# Patient Record
Sex: Female | Born: 1995 | Race: White | Hispanic: No | Marital: Single | Smoking: Current some day smoker
Health system: Southern US, Community
[De-identification: ages and names within clinical notes are randomized; demographics above are authoritative.]

## PROBLEM LIST (undated history)

## (undated) DIAGNOSIS — Z87820 Personal history of traumatic brain injury: Secondary | ICD-10-CM

---
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
## 2016-06-21 ENCOUNTER — Encounter: Payer: Self-pay | Admitting: Emergency Medicine

## 2016-06-21 ENCOUNTER — Emergency Department (INDEPENDENT_AMBULATORY_CARE_PROVIDER_SITE_OTHER)
Admission: EM | Admit: 2016-06-21 | Discharge: 2016-06-21 | Disposition: A | Discharge status: Home / Self Care | Payer: Self-pay | Source: Home / Self Care | Attending: Family Medicine | Admitting: Family Medicine

## 2016-06-21 DIAGNOSIS — Z3A01 Less than 8 weeks gestation of pregnancy: Secondary | ICD-10-CM

## 2016-06-21 DIAGNOSIS — R112 Nausea with vomiting, unspecified: Secondary | ICD-10-CM

## 2016-06-21 LAB — POCT URINALYSIS DIP (MANUAL ENTRY)
BILIRUBIN UA: NEGATIVE
Ketones, POC UA: NEGATIVE
Leukocytes, UA: NEGATIVE
NITRITE UA: NEGATIVE
Protein Ur, POC: NEGATIVE
RBC UA: NEGATIVE
Spec Grav, UA: 1.015 (ref 1.005–1.03)
Urobilinogen, UA: 0.2 (ref 0–1)
pH, UA: 7 (ref 5–8)

## 2016-06-21 LAB — POCT URINE PREGNANCY: Preg Test, Ur: POSITIVE — AB

## 2016-06-21 MED ORDER — PROMETHAZINE HCL 12.5 MG PO TABS: 12.5000 mg | ORAL_TABLET | Freq: Four times a day (QID) | ORAL | 0 refills | Status: DC | PRN

## 2016-06-21 NOTE — ED Provider Notes (Signed)
[facility]    CSN: [id number] Arrival date & time: 06/21/16  1628     History   Chief Complaint Chief Complaint  Patient presents with  . Abdominal Pain  . Nausea  . Emesis    HPI Jenna Michaelslexis [facility] is a 21 y.o. female.   Patient complains of one week history of nausea/vomiting and occasional lower abdominal pain.  No fevers, chills, and sweats.  No pelvic pain.  No urinary symptoms.  Patient reports that her last menstrual period was approximately 1.5 months ago.  No vaginal discharge or bleeding.   The history is provided by the patient.  Abdominal Pain  Pain location:  RLQ and LLQ Pain quality: aching   Pain radiates to:  Does not radiate Pain severity:  Mild Onset quality:  Gradual Duration:  1 week Timing:  Sporadic Progression:  Unchanged Chronicity:  New Context: not awakening from sleep, not eating, not recent illness, not recent travel, not sick contacts and not suspicious food intake   Relieved by:  None tried Worsened by:  Nothing Ineffective treatments:  None tried Associated symptoms: nausea and vomiting   Associated symptoms: no anorexia, no belching, no chest pain, no chills, no constipation, no cough, no diarrhea, no dysuria, no fatigue, no fever, no hematemesis, no hematochezia, no hematuria, no melena, no shortness of breath, no sore throat, no vaginal bleeding and no vaginal discharge   Emesis  Associated symptoms: abdominal pain   Associated symptoms: no chills, no cough, no diarrhea, no fever and no sore throat     Past Medical History:  Diagnosis Date  . History of concussion     There are no active problems to display for this patient.   History reviewed. No pertinent surgical history.  OB History    Gravida Para Term Preterm AB Living   1             SAB TAB Ectopic Multiple Live Births                   Home Medications    Prior to Admission medications   Medication Sig Start Date End Date Taking? Authorizing Provider    promethazine (PHENERGAN) 12.5 MG tablet Take 1 tablet (12.5 mg total) by mouth every 6 (six) hours as needed for nausea or vomiting. 06/21/16   Lattie HawStephen A Beese, MD    Family History History reviewed. No pertinent family history.  Social History Social History  Substance Use Topics  . Smoking status: Current Every Day Smoker  . Smokeless tobacco: Never Used  . Alcohol use No     Allergies   Tylenol [acetaminophen]   Review of Systems Review of Systems  Constitutional: Negative for chills, fatigue and fever.  HENT: Negative for sore throat.   Respiratory: Negative for cough and shortness of breath.   Cardiovascular: Negative for chest pain.  Gastrointestinal: Positive for abdominal pain, nausea and vomiting. Negative for anorexia, constipation, diarrhea, hematemesis, hematochezia and melena.  Genitourinary: Negative for dysuria, hematuria, vaginal bleeding and vaginal discharge.     Physical Exam Triage Vital Signs ED Triage Vitals  Enc Vitals Group     BP 06/21/16 1659 112/62     Pulse Rate 06/21/16 1659 85     Resp 06/21/16 1659 16     Temp 06/21/16 1659 98.4 F (36.9 C)     Temp Source 06/21/16 1659 Oral     SpO2 06/21/16 1659 99 %     Weight 06/21/16 1700 143 lb (  64.9 kg)     Height 06/21/16 1700 5' 2.5" (1.588 m)     Head Circumference --      Peak Flow --      Pain Score --      Pain Loc --      Pain Edu? --      Excl. in GC? --    No data found.   Updated Vital Signs BP 112/62 (BP Location: Left Arm)   Pulse 85   Temp 98.4 F (36.9 C) (Oral)   Resp 16   Ht 5' 2.5" (1.588 m)   Wt 143 lb (64.9 kg)   LMP  (LMP Unknown)   SpO2 99%   BMI 25.74 kg/m   Visual Acuity Right Eye Distance:   Left Eye Distance:   Bilateral Distance:    Right Eye Near:   Left Eye Near:    Bilateral Near:     Physical Exam Nursing notes and Vital Signs reviewed. Appearance:  Patient appears stated age, and in no acute distress Eyes:  Pupils are equal, round, and  reactive to light and accomodation.  Extraocular movement is intact.  Conjunctivae are not inflamed  Ears:  Normal externally Nose:  Normal  Pharynx:  Normal; moist mucous membranes  Neck:  Supple.  No adenopathy. Lungs:  Clear to auscultation.  Breath sounds are equal.  Moving air well. Heart:  Regular rate and rhythm without murmurs, rubs, or gallops.  Abdomen:  Nontender without masses or hepatosplenomegaly.  Bowel sounds are present.  No CVA or flank tenderness.  Extremities:  No edema.  Skin:  No rash present.    UC Treatments / Results  Labs (all labs ordered are listed, but only abnormal results are displayed) Labs Reviewed  POCT URINE PREGNANCY - Abnormal; Notable for the following:       Result Value   Preg Test, Ur Positive (*)    All other components within normal limits  POCT URINALYSIS DIP (MANUAL ENTRY) negative    EKG  EKG Interpretation None       Radiology No results found.  Procedures Procedures (including critical care time)  Medications Ordered in UC Medications - No data to display   Initial Impression / Assessment and Plan / UC Course  I have reviewed the triage vital signs and the nursing notes.  Pertinent labs & imaging results that were available during my care of the patient were reviewed by me and considered in my medical decision making (see chart for details).  Clinical Course   Rx for Phenergan 12.5mg  Q6hr prn. If symptoms become significantly worse during the night or over the weekend, proceed to the local emergency room.  Followup with OBGYN as soon as possible.      Final Clinical Impressions(s) / UC Diagnoses   Final diagnoses:  Non-intractable vomiting with nausea, unspecified vomiting type  Less than [redacted] weeks gestation of pregnancy    New Prescriptions New Prescriptions   PROMETHAZINE (PHENERGAN) 12.5 MG TABLET    Take 1 tablet (12.5 mg total) by mouth every 6 (six) hours as needed for nausea or vomiting.     Lattie Haw, MD 07/04/16 [phone]

## 2016-06-21 NOTE — ED Triage Notes (Signed)
Patient reports nausea and vomiting with occasional abdominal pain for past week; uncertain LMP. No otcs today.

## 2016-06-21 NOTE — Discharge Instructions (Signed)
If symptoms become significantly worse during the night or over the weekend, proceed to the local emergency room.  

## 2020-08-11 ENCOUNTER — Other Ambulatory Visit: Payer: Self-pay

## 2020-08-11 ENCOUNTER — Encounter (HOSPITAL_BASED_OUTPATIENT_CLINIC_OR_DEPARTMENT_OTHER): Payer: Self-pay

## 2020-08-11 ENCOUNTER — Emergency Department (HOSPITAL_BASED_OUTPATIENT_CLINIC_OR_DEPARTMENT_OTHER)
Admission: EM | Admit: 2020-08-11 | Discharge: 2020-08-12 | Disposition: A | Discharge status: Home / Self Care | Payer: Medicaid Other | Attending: Emergency Medicine | Admitting: Emergency Medicine

## 2020-08-11 DIAGNOSIS — F17200 Nicotine dependence, unspecified, uncomplicated: Secondary | ICD-10-CM | POA: Diagnosis not present

## 2020-08-11 DIAGNOSIS — N760 Acute vaginitis: Secondary | ICD-10-CM | POA: Insufficient documentation

## 2020-08-11 DIAGNOSIS — B9689 Other specified bacterial agents as the cause of diseases classified elsewhere: Secondary | ICD-10-CM | POA: Insufficient documentation

## 2020-08-11 DIAGNOSIS — R1030 Lower abdominal pain, unspecified: Secondary | ICD-10-CM | POA: Diagnosis present

## 2020-08-11 DIAGNOSIS — N939 Abnormal uterine and vaginal bleeding, unspecified: Secondary | ICD-10-CM

## 2020-08-11 LAB — WET PREP, GENITAL
Sperm: NONE SEEN
Trich, Wet Prep: NONE SEEN
Yeast Wet Prep HPF POC: NONE SEEN

## 2020-08-11 LAB — PREGNANCY, URINE: Preg Test, Ur: NEGATIVE

## 2020-08-11 MED ORDER — METRONIDAZOLE 500 MG PO TABS: 500.0000 mg | ORAL_TABLET | Freq: Two times a day (BID) | ORAL | 0 refills | Status: DC

## 2020-08-11 MED ORDER — METRONIDAZOLE 500 MG PO TABS
500.0000 mg | ORAL_TABLET | Freq: Once | ORAL | Status: AC
  Administered 2020-08-11: 500 mg via ORAL
  Filled 2020-08-11: qty 1

## 2020-08-11 NOTE — ED Provider Notes (Signed)
[facility] EMERGENCY DEPARTMENT Provider Note   CSN: [id number] Arrival date & time: 08/11/20  1924     History Chief Complaint  Patient presents with  . Abdominal Pain  . Vaginal Bleeding    Jenna Glover is a 25 y.o. female G1 P1, presenting to the emergency department with complaint of vaginal discharge and lower abdominal pain that began yesterday.  She states she noticed some brown-tinged discharge or spotting.  She is also having lower abdominal pain that feels like a terrible menstrual cramps.  LMP is 07/19/2020, and was normal.  Usually has regular menstrual periods.  Denies fevers or chills, urinary symptoms.  She is sexually active and monogamous relationship with her fianc.  Does not currently have a gynecologist.   The history is provided by the patient.       Past Medical History:  Diagnosis Date  . History of concussion     There are no problems to display for this patient.   History reviewed. No pertinent surgical history.   OB History    Gravida  1   Para      Term      Preterm      AB      Living        SAB      IAB      Ectopic      Multiple      Live Births              History reviewed. No pertinent family history.  Social History   Tobacco Use  . Smoking status: Current Some Day Smoker  . Smokeless tobacco: Never Used  Vaping Use  . Vaping Use: Some days  Substance Use Topics  . Alcohol use: Yes    Comment: occassional  . Drug use: Yes    Types: Marijuana    Home Medications Prior to Admission medications   Medication Sig Start Date End Date Taking? Authorizing Provider  metroNIDAZOLE (FLAGYL) 500 MG tablet Take 1 tablet (500 mg total) by mouth 2 (two) times daily. 08/11/20  Yes Balbina Depace, [place] N, PA-C  promethazine (PHENERGAN) 12.5 MG tablet Take 1 tablet (12.5 mg total) by mouth every 6 (six) hours as needed for nausea or vomiting. 06/21/16   Lattie Haw, MD    Allergies    Tylenol  [acetaminophen]  Review of Systems   Review of Systems  Genitourinary: Positive for pelvic pain and vaginal discharge.  All other systems reviewed and are negative.   Physical Exam Updated Vital Signs BP 119/80   Pulse 60   Temp 98 F (36.7 C) (Oral)   Resp 18   Ht 5\' 3"  (1.6 m)   Wt 61.7 kg   LMP 07/28/2020   SpO2 96%   Breastfeeding Unknown   BMI 24.09 kg/m   Physical Exam Vitals and nursing note reviewed.  Constitutional:      General: She is not in acute distress.    Appearance: She is well-developed and well-nourished.  HENT:     Head: Normocephalic and atraumatic.  Eyes:     Conjunctiva/sclera: Conjunctivae normal.  Cardiovascular:     Rate and Rhythm: Normal rate and regular rhythm.  Pulmonary:     Effort: Pulmonary effort is normal.     Breath sounds: Normal breath sounds.  Abdominal:     General: Abdomen is flat.     Palpations: Abdomen is soft.     Tenderness: There is no abdominal tenderness.  Genitourinary:  Comments: Exam performed with female nurse tech chaperone present.  There is clear cervical mucus present with streaks of brown.  No CMT.  There is some discomfort with palpation of the left adnexa. Skin:    General: Skin is warm.  Neurological:     Mental Status: She is alert.  Psychiatric:        Mood and Affect: Mood and affect normal.        Behavior: Behavior normal.     ED Results / Procedures / Treatments   Labs (all labs ordered are listed, but only abnormal results are displayed) Labs Reviewed  WET PREP, GENITAL - Abnormal; Notable for the following components:      Result Value   Clue Cells Wet Prep HPF POC PRESENT (*)    WBC, Wet Prep HPF POC FEW (*)    All other components within normal limits  PREGNANCY, URINE  GC/CHLAMYDIA PROBE AMP ([place]) NOT AT [facility]    EKG None  Radiology No results found.  Procedures Procedures   Medications Ordered in ED Medications  metroNIDAZOLE (FLAGYL) tablet 500 mg (500 mg  Oral Given by Other 08/11/20 2358)  ketorolac (TORADOL) 30 MG/ML injection 30 mg (30 mg Intramuscular Given 08/12/20 0007)    ED Course  I have reviewed the triage vital signs and the nursing notes.  Pertinent labs & imaging results that were available during my care of the patient were reviewed by me and considered in my medical decision making (see chart for details).    MDM Rules/Calculators/A&P                          Patient presenting for brown-tinged vaginal discharge on lower abdominal cramping since yesterday.  Her urine preg is negative.  On exam she is well-appearing in no distress.  Pelvic exam with discharge as described.  Some mild discomfort on bimanual though no significant pain was elicited.  Her wet prep shows few white cells and clue cells present.  GC/Chlamydia cultures are sent and pending.  Considering patient's change in discharge, will cover for BV.  She is provided with GYN referral for follow-up.  Exam not concerning for PID, low risk sexual behavior and few white cells on wet prep.  Symptomatic management with over-the-counter NSAIDs, topical heat.  Return precautions.  Discussed results, findings, treatment and follow up. Patient advised of return precautions. Patient verbalized understanding and agreed with plan.  Final Clinical Impression(s) / ED Diagnoses Final diagnoses:  BV (bacterial vaginosis)  Vaginal spotting    Rx / DC Orders ED Discharge Orders         Ordered    metroNIDAZOLE (FLAGYL) 500 MG tablet  2 times daily        08/11/20 2351           Breeona Waid, [place] N, [place] 08/12/20 0024    Maia Plan, MD 08/19/20 1006

## 2020-08-11 NOTE — ED Triage Notes (Signed)
Pt arrives c/o brown vaginal discharge intermittently with lower abdominal/pelvic pain. Nausea with increases in pain. Went UC today, negative pregnancy, was told to come here if pain didn't improve.

## 2020-08-11 NOTE — Discharge Instructions (Addendum)
Please read the instructions below.  Talk with your primary care provider about any new medications.  Please schedule an appointment for follow up with your OBGYN or primary care.  Finish your antibiotic (Flagyl/Metronidazole) as prescribed. Do not drink alcohol with this medication as it will cause vomiting.  You will receive a call from the hospital if your test results come back positive. Avoid all sexual activity until you know your test results. If your results come back positive, it is important that you inform all of your sexual partners.  Return to the ER for high fever, severe abdominal pain, or new or worsening symptoms.

## 2020-08-12 MED ORDER — KETOROLAC TROMETHAMINE 30 MG/ML IJ SOLN
30.0000 mg | Freq: Once | INTRAMUSCULAR | Status: DC
  Filled 2020-08-12: qty 1

## 2020-08-12 MED ORDER — KETOROLAC TROMETHAMINE 30 MG/ML IJ SOLN
30.0000 mg | Freq: Once | INTRAMUSCULAR | Status: AC
  Administered 2020-08-12: 30 mg via INTRAMUSCULAR

## 2020-08-13 LAB — GC/CHLAMYDIA PROBE AMP (~~LOC~~) NOT AT ARMC
Chlamydia: NEGATIVE
Comment: NEGATIVE
Comment: NORMAL
Neisseria Gonorrhea: NEGATIVE

## 2020-11-09 ENCOUNTER — Emergency Department (INDEPENDENT_AMBULATORY_CARE_PROVIDER_SITE_OTHER)
Admission: EM | Admit: 2020-11-09 | Discharge: 2020-11-09 | Disposition: A | Discharge status: Home / Self Care | Payer: Medicaid Other | Source: Home / Self Care

## 2020-11-09 ENCOUNTER — Encounter: Payer: Self-pay | Admitting: Emergency Medicine

## 2020-11-09 ENCOUNTER — Other Ambulatory Visit: Payer: Self-pay

## 2020-11-09 DIAGNOSIS — T63441A Toxic effect of venom of bees, accidental (unintentional), initial encounter: Secondary | ICD-10-CM

## 2020-11-09 NOTE — ED Triage Notes (Signed)
Patient here day 4 having been stung by flying insect/possible wasp on upper outer left thigh; now large area around stinger site is discolored; somewhat itchy. Has tried benadryl which helps a little. Has not had any covid vaccinations.

## 2020-11-11 NOTE — ED Provider Notes (Signed)
[facility]    CSN: [id number] Arrival date & time: 11/09/20  1909      History   Chief Complaint Chief Complaint  Patient presents with  . Insect Bite    HPI Jenna Glover is a 25 y.o. female.   Pt reports she ws stung by a wasp 4 days ago.  Pt reports she has a swollen bruised area around sting.    The history is provided by the patient. No language interpreter was used.  Leg Pain Location:  Leg Ineffective treatments:  None tried Associated symptoms: swelling     Past Medical History:  Diagnosis Date  . History of concussion     There are no problems to display for this patient.   History reviewed. No pertinent surgical history.  OB History    Gravida  1   Para      Term      Preterm      AB      Living        SAB      IAB      Ectopic      Multiple      Live Births               Home Medications    Prior to Admission medications   Medication Sig Start Date End Date Taking? Authorizing Provider  metroNIDAZOLE (FLAGYL) 500 MG tablet Take 1 tablet (500 mg total) by mouth 2 (two) times daily. 08/11/20   Robinson, [place] N, PA-C  promethazine (PHENERGAN) 12.5 MG tablet Take 1 tablet (12.5 mg total) by mouth every 6 (six) hours as needed for nausea or vomiting. 06/21/16   Lattie Haw, MD    Family History No family history on file.  Social History Social History   Tobacco Use  . Smoking status: Current Some Day Smoker  . Smokeless tobacco: Never Used  Vaping Use  . Vaping Use: Some days  Substance Use Topics  . Alcohol use: Yes    Comment: occassional  . Drug use: Yes    Types: Marijuana     Allergies   Tylenol [acetaminophen]   Review of Systems Review of Systems  All other systems reviewed and are negative.    Physical Exam Triage Vital Signs ED Triage Vitals  Enc Vitals Group     BP 11/09/20 1930 107/72     Pulse Rate 11/09/20 1930 78     Resp 11/09/20 1930 16     Temp 11/09/20 1930 98.2 F  (36.8 C)     Temp Source 11/09/20 1930 Oral     SpO2 11/09/20 1930 98 %     Weight 11/09/20 1934 135 lb (61.2 kg)     Height 11/09/20 1934 5\' 3"  (1.6 m)     Head Circumference --      Peak Flow --      Pain Score 11/09/20 1934 0     Pain Loc --      Pain Edu? --      Excl. in GC? --    No data found.  Updated Vital Signs BP 107/72 (BP Location: Right Arm)   Pulse 78   Temp 98.2 F (36.8 C) (Oral)   Resp 16   Ht 5\' 3"  (1.6 m)   Wt 61.2 kg   LMP 09/16/2020 (Approximate)   SpO2 98%   BMI 23.91 kg/m   Visual Acuity Right Eye Distance:   Left Eye Distance:   Bilateral Distance:  Right Eye Near:   Left Eye Near:    Bilateral Near:     Physical Exam Vitals and nursing note reviewed.  Constitutional:      Appearance: She is well-developed.  HENT:     Head: Normocephalic.  Musculoskeletal:     Cervical back: Normal range of motion.  Skin:    General: Skin is warm.     Findings: Bruising and erythema present.     Comments: 10 cm bruised area with small area of induration in center   Neurological:     Mental Status: She is alert and oriented to person, place, and time.  Psychiatric:        Mood and Affect: Mood normal.      UC Treatments / Results  Labs (all labs ordered are listed, but only abnormal results are displayed) Labs Reviewed - No data to display  EKG   Radiology No results found.  Procedures Procedures (including critical care time)  Medications Ordered in UC Medications - No data to display  Initial Impression / Assessment and Plan / UC Course  I have reviewed the triage vital signs and the nursing notes.  Pertinent labs & imaging results that were available during my care of the patient were reviewed by me and considered in my medical decision making (see chart for details).     MDM;  Pt counseled on insect bites  Final Clinical Impressions(s) / UC Diagnoses   Final diagnoses:  Bee sting, accidental or unintentional, initial  encounter   Discharge Instructions   None    ED Prescriptions    None     PDMP not reviewed this encounter.  An After Visit Summary was printed and given to the patient.    Elson Areas, [place] 11/11/20 1506

## 2021-04-05 ENCOUNTER — Emergency Department (HOSPITAL_BASED_OUTPATIENT_CLINIC_OR_DEPARTMENT_OTHER): Payer: Medicaid Other | Admitting: Radiology

## 2021-04-05 ENCOUNTER — Other Ambulatory Visit: Payer: Self-pay

## 2021-04-05 ENCOUNTER — Encounter (HOSPITAL_BASED_OUTPATIENT_CLINIC_OR_DEPARTMENT_OTHER): Payer: Self-pay

## 2021-04-05 ENCOUNTER — Emergency Department (HOSPITAL_BASED_OUTPATIENT_CLINIC_OR_DEPARTMENT_OTHER): Payer: Medicaid Other

## 2021-04-05 ENCOUNTER — Observation Stay (HOSPITAL_BASED_OUTPATIENT_CLINIC_OR_DEPARTMENT_OTHER)
Admission: EM | Admit: 2021-04-05 | Discharge: 2021-04-07 | Disposition: A | Discharge status: Home / Self Care | Payer: Medicaid Other | Attending: Internal Medicine | Admitting: Internal Medicine

## 2021-04-05 DIAGNOSIS — Z20822 Contact with and (suspected) exposure to covid-19: Secondary | ICD-10-CM | POA: Insufficient documentation

## 2021-04-05 DIAGNOSIS — R55 Syncope and collapse: Secondary | ICD-10-CM | POA: Diagnosis present

## 2021-04-05 DIAGNOSIS — B9620 Unspecified Escherichia coli [E. coli] as the cause of diseases classified elsewhere: Secondary | ICD-10-CM | POA: Diagnosis not present

## 2021-04-05 DIAGNOSIS — R404 Transient alteration of awareness: Secondary | ICD-10-CM | POA: Diagnosis not present

## 2021-04-05 DIAGNOSIS — F17200 Nicotine dependence, unspecified, uncomplicated: Secondary | ICD-10-CM | POA: Insufficient documentation

## 2021-04-05 DIAGNOSIS — R519 Headache, unspecified: Secondary | ICD-10-CM | POA: Diagnosis not present

## 2021-04-05 DIAGNOSIS — Z79899 Other long term (current) drug therapy: Secondary | ICD-10-CM | POA: Insufficient documentation

## 2021-04-05 LAB — RESP PANEL BY RT-PCR (FLU A&B, COVID) ARPGX2
Influenza A by PCR: NEGATIVE
Influenza B by PCR: NEGATIVE
SARS Coronavirus 2 by RT PCR: NEGATIVE

## 2021-04-05 LAB — BASIC METABOLIC PANEL
Anion gap: 9 (ref 5–15)
BUN: 10 mg/dL (ref 6–20)
CO2: 21 mmol/L — ABNORMAL LOW (ref 22–32)
Calcium: 9.1 mg/dL (ref 8.9–10.3)
Chloride: 106 mmol/L (ref 98–111)
Creatinine, Ser: 0.64 mg/dL (ref 0.44–1.00)
GFR, Estimated: >60 mL/min (ref >60)
Glucose, Bld: 87 mg/dL (ref 70–99)
Potassium: 4.1 mmol/L (ref 3.5–5.1)
Sodium: 136 mmol/L (ref 135–145)

## 2021-04-05 LAB — HEPATIC FUNCTION PANEL
ALT: 9 U/L (ref 0–44)
AST: 13 U/L — ABNORMAL LOW (ref 15–41)
Albumin: 4.6 g/dL (ref 3.5–5.0)
Alkaline Phosphatase: 35 U/L — ABNORMAL LOW (ref 38–126)
Bilirubin, Direct: 0.1 mg/dL (ref 0.0–0.2)
Indirect Bilirubin: 0.3 mg/dL (ref 0.3–0.9)
Total Bilirubin: 0.4 mg/dL (ref 0.3–1.2)
Total Protein: 7.3 g/dL (ref 6.5–8.1)

## 2021-04-05 LAB — URINALYSIS, ROUTINE W REFLEX MICROSCOPIC
Bilirubin Urine: NEGATIVE
Glucose, UA: NEGATIVE mg/dL
Hgb urine dipstick: NEGATIVE
Ketones, ur: NEGATIVE mg/dL
Nitrite: POSITIVE — AB
Protein, ur: NEGATIVE mg/dL
Specific Gravity, Urine: 1.006 (ref 1.005–1.030)
pH: 5.5 (ref 5.0–8.0)

## 2021-04-05 LAB — CBC
HCT: 41.5 % (ref 36.0–46.0)
Hemoglobin: 14 g/dL (ref 12.0–15.0)
MCH: 29.3 pg (ref 26.0–34.0)
MCHC: 33.7 g/dL (ref 30.0–36.0)
MCV: 86.8 fL (ref 80.0–100.0)
Platelets: 244 10*3/uL (ref 150–400)
RBC: 4.78 MIL/uL (ref 3.87–5.11)
RDW: 11.5 % (ref 11.5–15.5)
WBC: 8.8 10*3/uL (ref 4.0–10.5)
nRBC: 0 % (ref 0.0–0.2)

## 2021-04-05 LAB — PROTIME-INR
INR: 1 (ref 0.8–1.2)
Prothrombin Time: 13 seconds (ref 11.4–15.2)

## 2021-04-05 LAB — CBG MONITORING, ED: Glucose-Capillary: 85 mg/dL (ref 70–99)

## 2021-04-05 LAB — MAGNESIUM: Magnesium: 2.1 mg/dL (ref 1.7–2.4)

## 2021-04-05 LAB — PREGNANCY, URINE: Preg Test, Ur: NEGATIVE

## 2021-04-05 LAB — TSH: TSH: 4.027 u[IU]/mL (ref 0.350–4.500)

## 2021-04-05 IMAGING — CT CT ANGIO NECK
3 of 8 series · 10 of 36 positions shown · IV contrast (APPLIED)
Comparison: None.

CLINICAL DATA: Syncope, recurrent Per neurology, CTA head and neck
both with normal positioning and with maximal neck extension as this
has provoked patient's syncope.

EXAM:
CT ANGIOGRAPHY HEAD AND NECK
TECHNIQUE: Multidetector CT imaging of the head and neck was performed using
the standard protocol during bolus administration of intravenous
contrast. Multiplanar CT image reconstructions and MIPs were
obtained to evaluate the vascular anatomy. Carotid stenosis
measurements (when applicable) are obtained utilizing NASCET
criteria, using the distal internal carotid diameter as the
denominator. CTA of the neck was performed in neutral position and
extension due to the patient's worsening syncope with extension.
CONTRAST:  60mL OMNIPAQUE IOHEXOL 350 MG/ML SOLN, 60mL OMNIPAQUE
IOHEXOL 350 MG/ML SOLN

[Series 5: cta neck in extension · axial · 0.36mm/px · z∈[-118,+53]mm · 6 of 482 slices shown]
[im 69/482  soft-tissue]
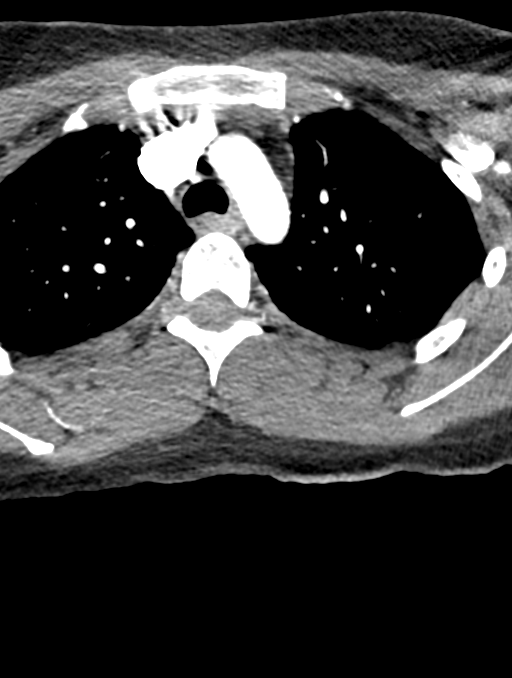
[im 138/482  bone]
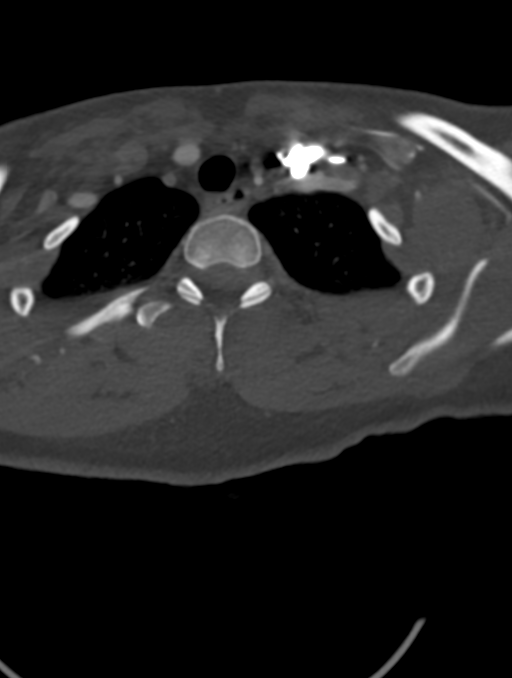
[im 207/482  soft-tissue]
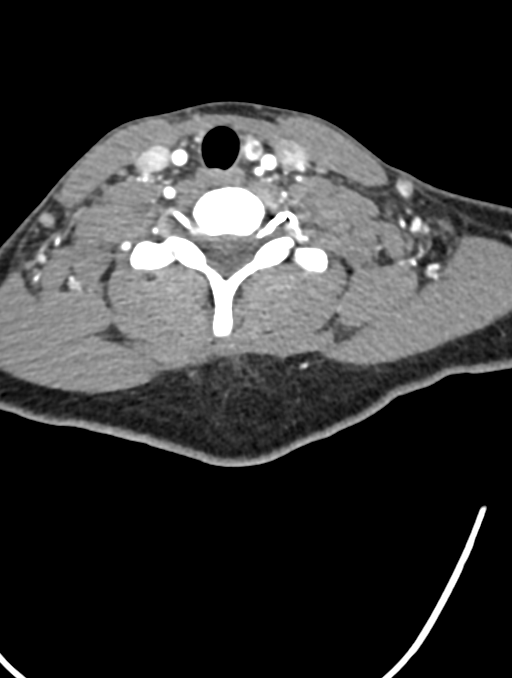
[im 275/482  bone]
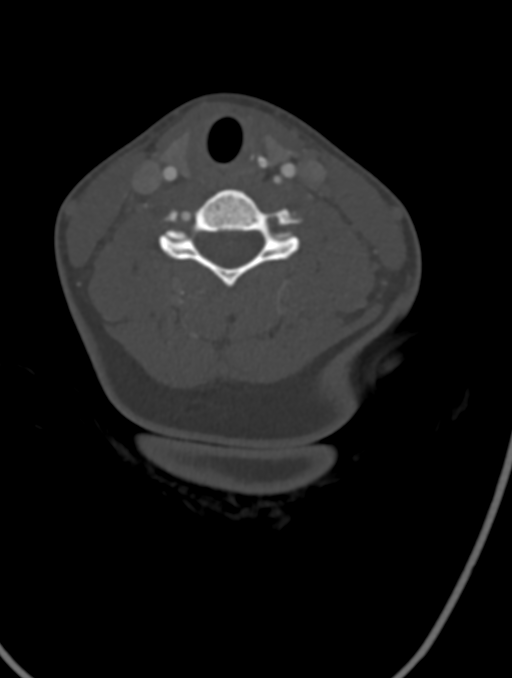
[im 344/482  soft-tissue]
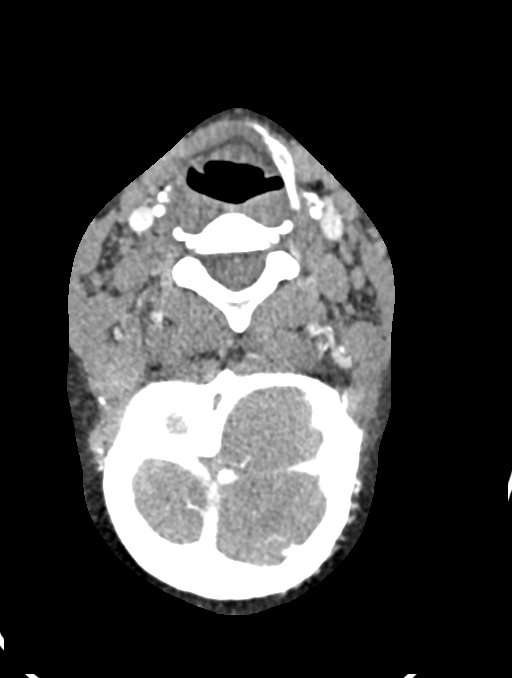
[im 413/482  bone]
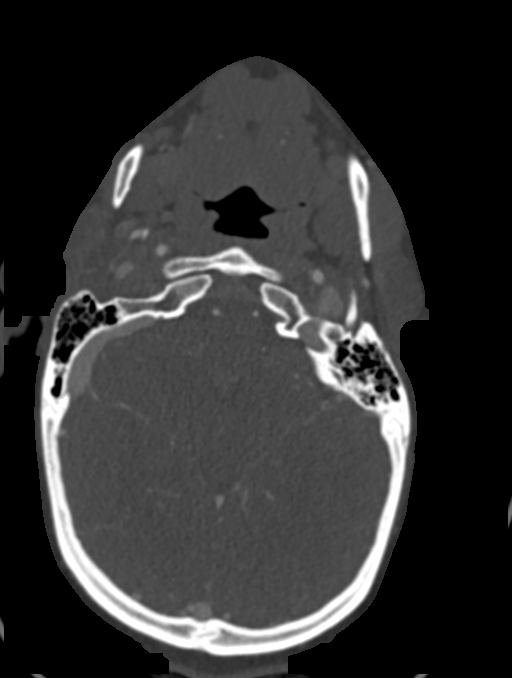

[Series 6: ax thin in extension · axial · 0.36mm/px · z∈[-72,+8]mm · 2 of 241 slices shown]
[im 81/241  soft-tissue]
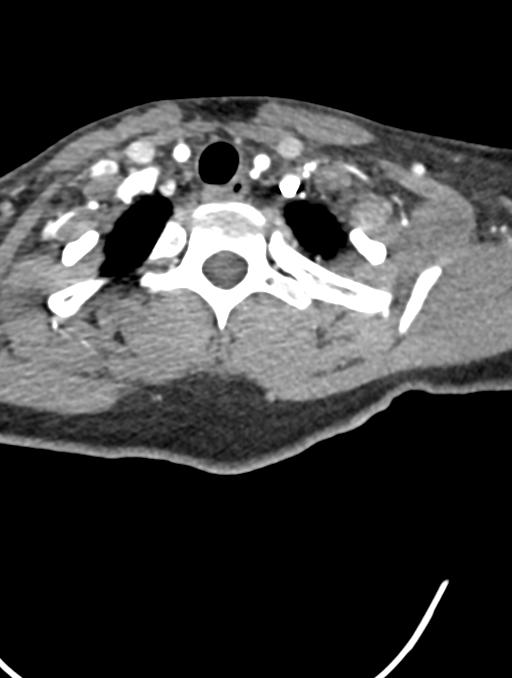
[im 161/241  soft-tissue]
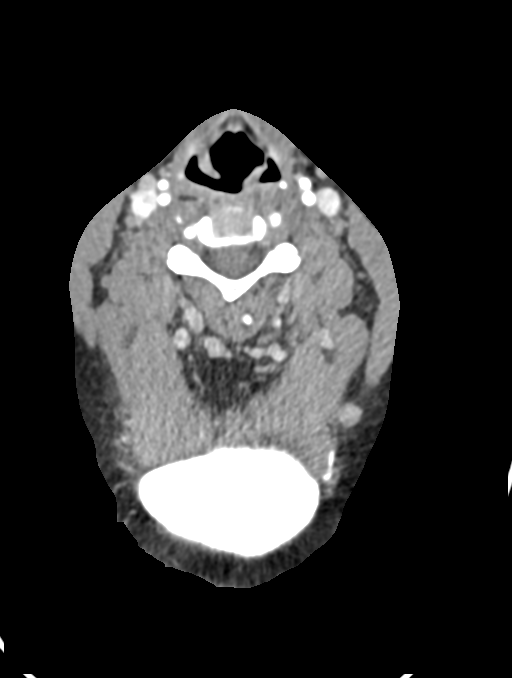

[Series 8: sag thin in extension · sagittal · 0.47mm/px · 2 of 183 slices shown]
[im 28/183  soft-tissue]
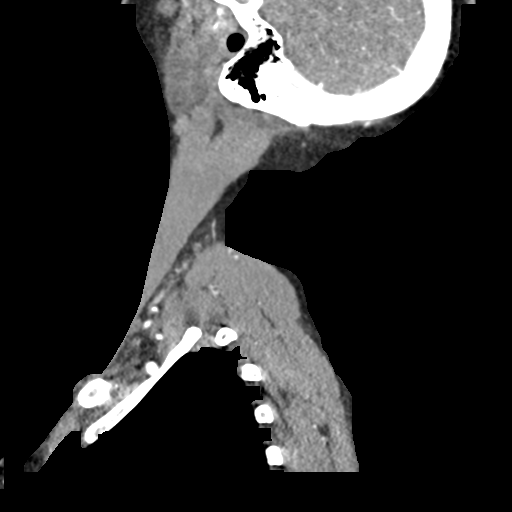
[im 156/183  soft-tissue]
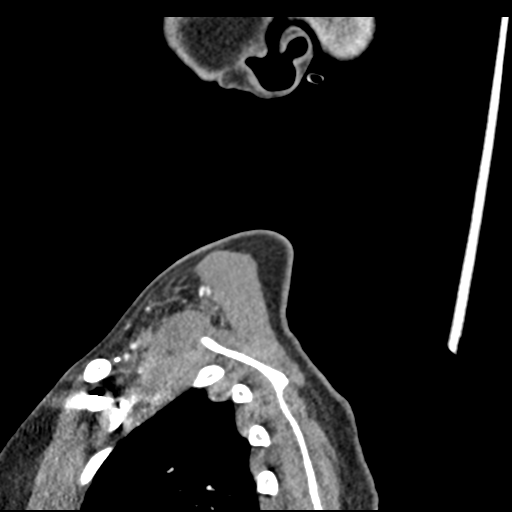

[10 of 36 positions shown; findings below may reference images not displayed]

FINDINGS: CT HEAD FINDINGS

Brain: No evidence of acute large vascular territory infarct.
Infarction, hemorrhage, hydrocephalus, extra-axial collection or
mass lesion/mass effect.

Vascular: See below.

Skull: No acute fracture.

Sinuses: Visualized sinuses are clear.

Orbits: No acute findings in the visualized orbits.

Review of the MIP images confirms the above findings

CTA NECK FINDINGS

Aortic arch: Great vessel origins are patent.

Right carotid system: No evidence of dissection, stenosis (50% or
greater) or occlusion on either neutral or extension imaging. The
left.

Left carotid system: No evidence of dissection, stenosis (50% or
greater) or occlusion on either neutral or extension imaging.

Vertebral arteries: Codominant. No evidence of dissection, stenosis
(50% or greater) or occlusion. The left vertebral artery enters the
left transverse foramen high at the C3 level.

Skeleton: Mild degenerative change.

Other neck: No evidence of acute abnormality. Reversal of the normal
cervical lordosis.

Upper chest: Visualized lung apices are clear.

Review of the MIP images confirms the above findings

CTA HEAD FINDINGS

Anterior circulation: Bilateral intracranial ICAs, MCAs, and ACAs
are patent without proximal hemodynamically significant stenosis.
Limited distal vessel evaluation due to venous contamination.

Posterior circulation: Bilateral intradural vertebral arteries,
basilar artery, and posterior cerebral arteries are patent without
proximal hemodynamically significant stenosis.

Venous sinuses: As permitted by contrast timing, patent.

Review of the MIP images confirms the above findings
IMPRESSION: CT head

No evidence of acute intracranial abnormality.

CTA head:

No large vessel occlusion or proximal hemodynamically significant
stenosis. Limited evaluation due to venous contamination.

CTA neck:

1. No evidence of significant (greater than 50%) stenosis with
neutral and extension positioning.
2. The left vertebral artery enters the left transverse foramen high
at the C3 level.

## 2021-04-05 IMAGING — CT CT ANGIO NECK
2 of 11 series · 7 of 33 positions shown · IV contrast (omnipaque)
Comparison: None.

CLINICAL DATA: Syncope, recurrent Per neurology, CTA head and neck
both with normal positioning and with maximal neck extension as this
has provoked patient's syncope.

EXAM:
CT ANGIOGRAPHY HEAD AND NECK
TECHNIQUE: Multidetector CT imaging of the head and neck was performed using
the standard protocol during bolus administration of intravenous
contrast. Multiplanar CT image reconstructions and MIPs were
obtained to evaluate the vascular anatomy. Carotid stenosis
measurements (when applicable) are obtained utilizing NASCET
criteria, using the distal internal carotid diameter as the
denominator. CTA of the neck was performed in neutral position and
extension due to the patient's worsening syncope with extension.
CONTRAST:  60mL OMNIPAQUE IOHEXOL 350 MG/ML SOLN, 60mL OMNIPAQUE
IOHEXOL 350 MG/ML SOLN

[Series 8: cta head · axial · 0.50mm/px · z∈[-78,+38]mm · 2 of 174 slices shown]
[im 58/174  soft-tissue]
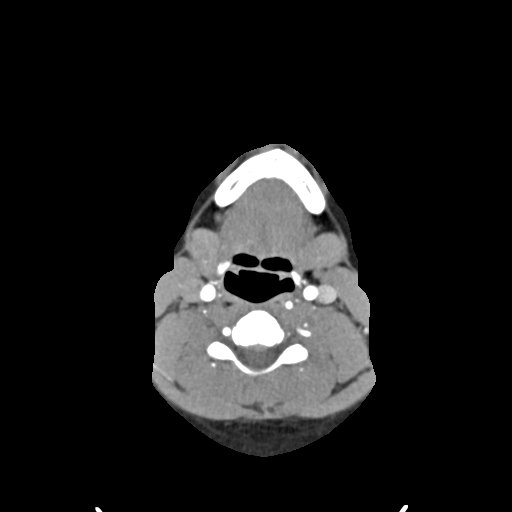
[im 116/174  bone]
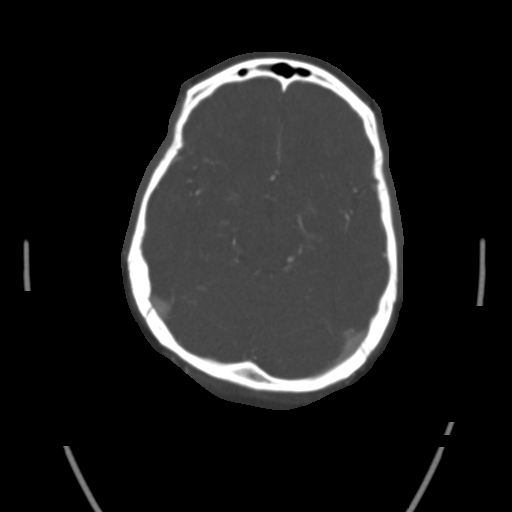

[Series 10: ax thin · axial · 0.35mm/px · z∈[-165,+52]mm · 5 of 335 slices shown]
[im 56/335  soft-tissue]
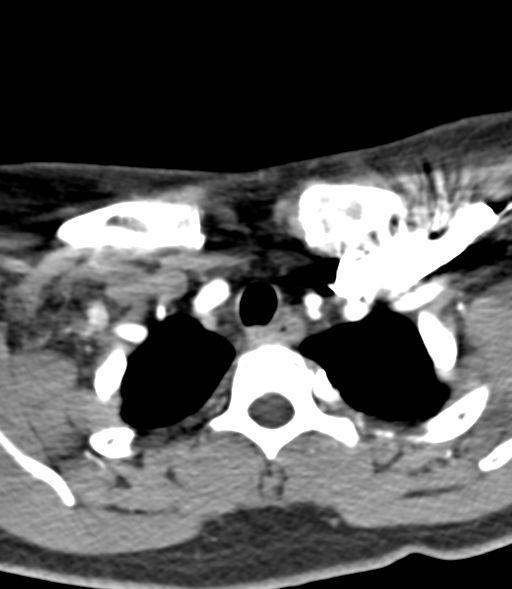
[im 112/335  soft-tissue]
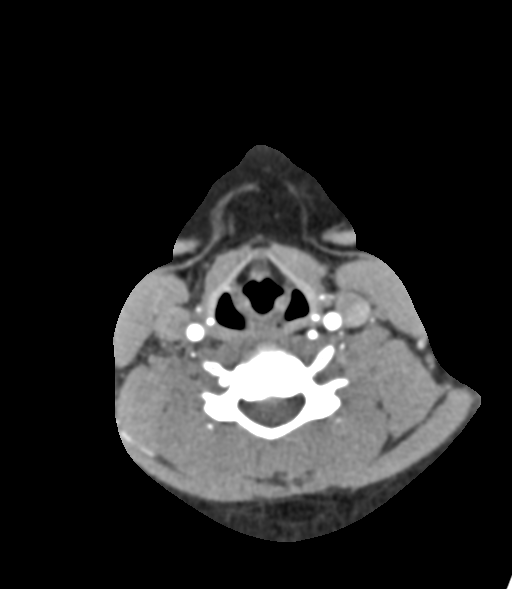
[im 168/335  soft-tissue]
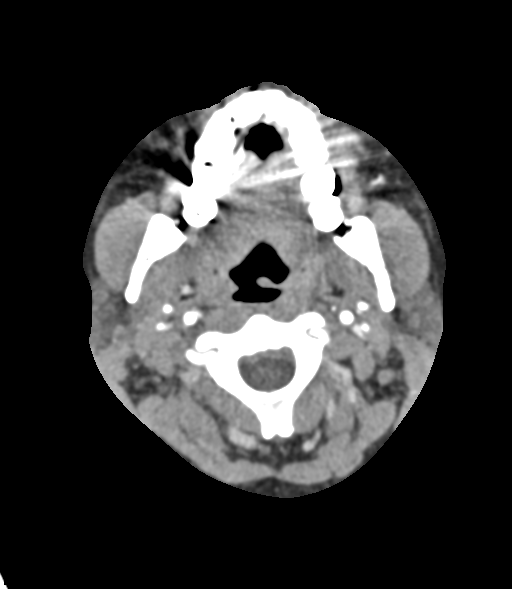
[im 223/335  soft-tissue]
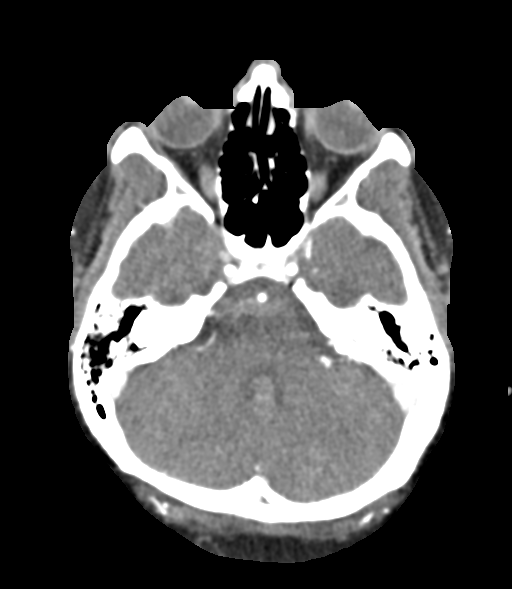
[im 279/335  soft-tissue]
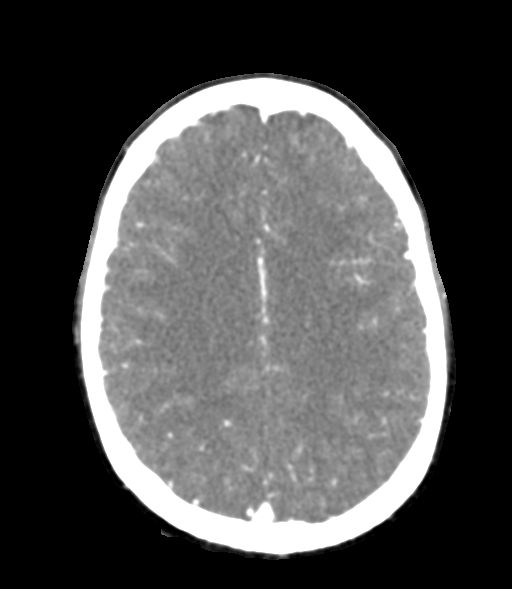

[7 of 33 positions shown; findings below may reference images not displayed]

FINDINGS: CT HEAD FINDINGS

Brain: No evidence of acute large vascular territory infarct.
Infarction, hemorrhage, hydrocephalus, extra-axial collection or
mass lesion/mass effect.

Vascular: See below.

Skull: No acute fracture.

Sinuses: Visualized sinuses are clear.

Orbits: No acute findings in the visualized orbits.

Review of the MIP images confirms the above findings

CTA NECK FINDINGS

Aortic arch: Great vessel origins are patent.

Right carotid system: No evidence of dissection, stenosis (50% or
greater) or occlusion on either neutral or extension imaging. The
left.

Left carotid system: No evidence of dissection, stenosis (50% or
greater) or occlusion on either neutral or extension imaging.

Vertebral arteries: Codominant. No evidence of dissection, stenosis
(50% or greater) or occlusion. The left vertebral artery enters the
left transverse foramen high at the C3 level.

Skeleton: Mild degenerative change.

Other neck: No evidence of acute abnormality. Reversal of the normal
cervical lordosis.

Upper chest: Visualized lung apices are clear.

Review of the MIP images confirms the above findings

CTA HEAD FINDINGS

Anterior circulation: Bilateral intracranial ICAs, MCAs, and ACAs
are patent without proximal hemodynamically significant stenosis.
Limited distal vessel evaluation due to venous contamination.

Posterior circulation: Bilateral intradural vertebral arteries,
basilar artery, and posterior cerebral arteries are patent without
proximal hemodynamically significant stenosis.

Venous sinuses: As permitted by contrast timing, patent.

Review of the MIP images confirms the above findings
IMPRESSION: CT head

No evidence of acute intracranial abnormality.

CTA head:

No large vessel occlusion or proximal hemodynamically significant
stenosis. Limited evaluation due to venous contamination.

CTA neck:

1. No evidence of significant (greater than 50%) stenosis with
neutral and extension positioning.
2. The left vertebral artery enters the left transverse foramen high
at the C3 level.

## 2021-04-05 IMAGING — CT CT ANGIO HEAD
2 of 11 series · 7 of 33 positions shown · IV contrast (APPLIED)
Comparison: None.

CLINICAL DATA: Syncope, recurrent Per neurology, CTA head and neck
both with normal positioning and with maximal neck extension as this
has provoked patient's syncope.

EXAM:
CT ANGIOGRAPHY HEAD AND NECK
TECHNIQUE: Multidetector CT imaging of the head and neck was performed using
the standard protocol during bolus administration of intravenous
contrast. Multiplanar CT image reconstructions and MIPs were
obtained to evaluate the vascular anatomy. Carotid stenosis
measurements (when applicable) are obtained utilizing NASCET
criteria, using the distal internal carotid diameter as the
denominator. CTA of the neck was performed in neutral position and
extension due to the patient's worsening syncope with extension.
CONTRAST:  60mL OMNIPAQUE IOHEXOL 350 MG/ML SOLN, 60mL OMNIPAQUE
IOHEXOL 350 MG/ML SOLN

[Series 8: cta head · axial · 0.50mm/px · z∈[-78,+38]mm · 2 of 174 slices shown]
[im 58/174  soft-tissue]
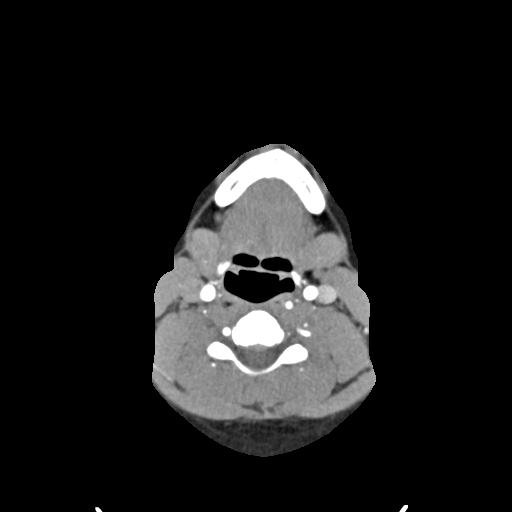
[im 116/174  bone]
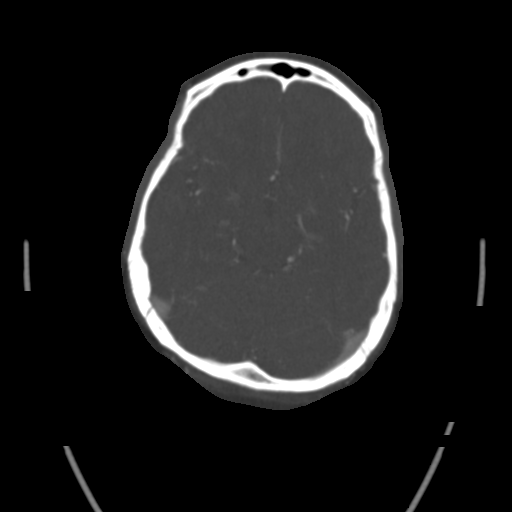

[Series 10: ax thin · axial · 0.35mm/px · z∈[-165,+52]mm · 5 of 335 slices shown]
[im 56/335  soft-tissue]
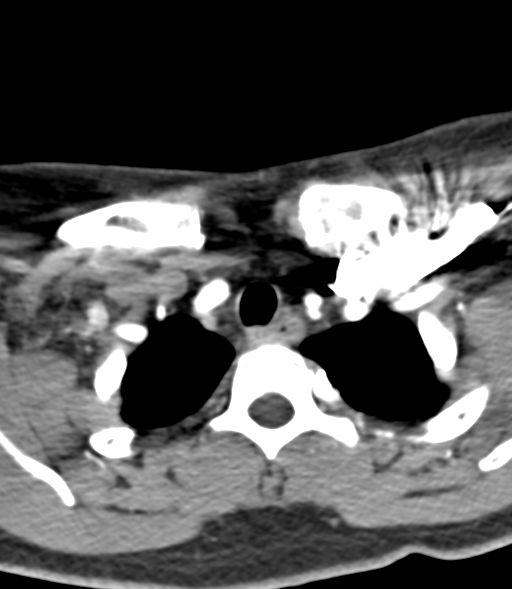
[im 112/335  soft-tissue]
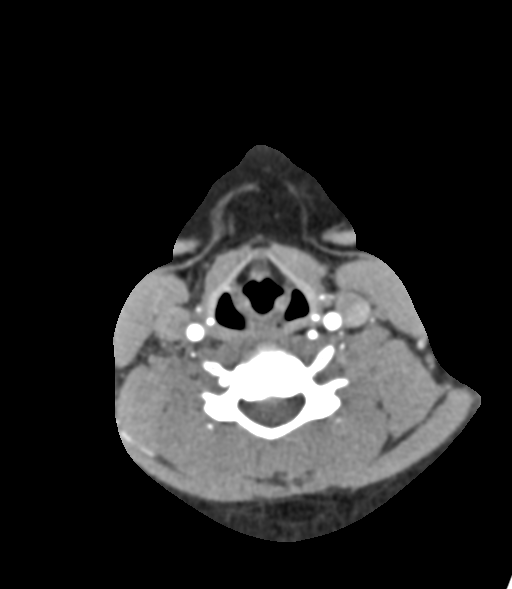
[im 168/335  soft-tissue]
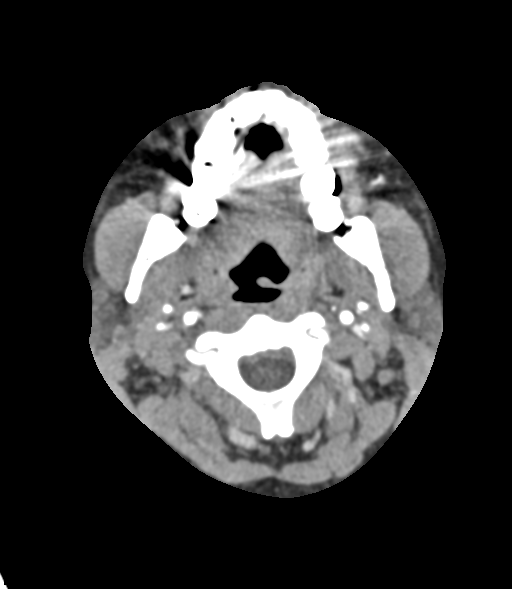
[im 223/335  soft-tissue]
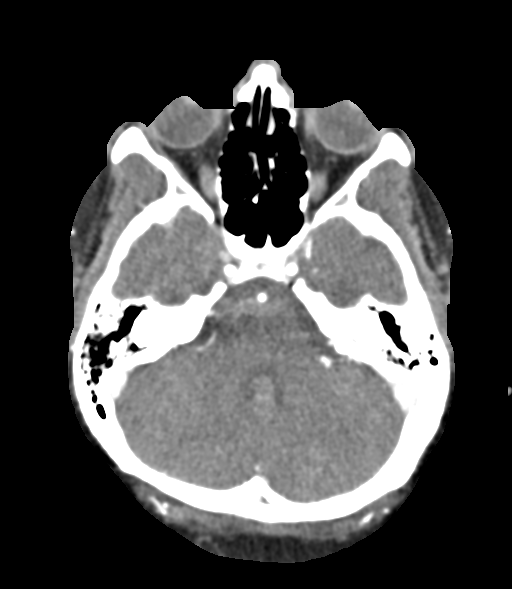
[im 279/335  soft-tissue]
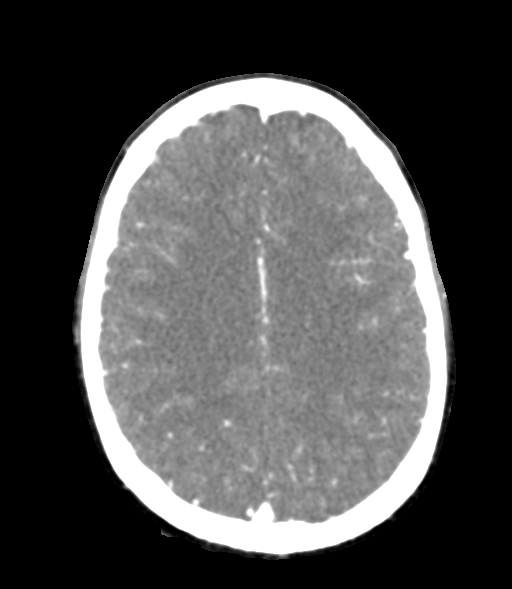

[7 of 33 positions shown; findings below may reference images not displayed]

FINDINGS: CT HEAD FINDINGS

Brain: No evidence of acute large vascular territory infarct.
Infarction, hemorrhage, hydrocephalus, extra-axial collection or
mass lesion/mass effect.

Vascular: See below.

Skull: No acute fracture.

Sinuses: Visualized sinuses are clear.

Orbits: No acute findings in the visualized orbits.

Review of the MIP images confirms the above findings

CTA NECK FINDINGS

Aortic arch: Great vessel origins are patent.

Right carotid system: No evidence of dissection, stenosis (50% or
greater) or occlusion on either neutral or extension imaging. The
left.

Left carotid system: No evidence of dissection, stenosis (50% or
greater) or occlusion on either neutral or extension imaging.

Vertebral arteries: Codominant. No evidence of dissection, stenosis
(50% or greater) or occlusion. The left vertebral artery enters the
left transverse foramen high at the C3 level.

Skeleton: Mild degenerative change.

Other neck: No evidence of acute abnormality. Reversal of the normal
cervical lordosis.

Upper chest: Visualized lung apices are clear.

Review of the MIP images confirms the above findings

CTA HEAD FINDINGS

Anterior circulation: Bilateral intracranial ICAs, MCAs, and ACAs
are patent without proximal hemodynamically significant stenosis.
Limited distal vessel evaluation due to venous contamination.

Posterior circulation: Bilateral intradural vertebral arteries,
basilar artery, and posterior cerebral arteries are patent without
proximal hemodynamically significant stenosis.

Venous sinuses: As permitted by contrast timing, patent.

Review of the MIP images confirms the above findings
IMPRESSION: CT head

No evidence of acute intracranial abnormality.

CTA head:

No large vessel occlusion or proximal hemodynamically significant
stenosis. Limited evaluation due to venous contamination.

CTA neck:

1. No evidence of significant (greater than 50%) stenosis with
neutral and extension positioning.
2. The left vertebral artery enters the left transverse foramen high
at the C3 level.

## 2021-04-05 IMAGING — CT CT ANGIO NECK
2 of 11 series · 7 of 33 positions shown · IV contrast (APPLIED)
Comparison: None.

CLINICAL DATA: Syncope, recurrent Per neurology, CTA head and neck
both with normal positioning and with maximal neck extension as this
has provoked patient's syncope.

EXAM:
CT ANGIOGRAPHY HEAD AND NECK
TECHNIQUE: Multidetector CT imaging of the head and neck was performed using
the standard protocol during bolus administration of intravenous
contrast. Multiplanar CT image reconstructions and MIPs were
obtained to evaluate the vascular anatomy. Carotid stenosis
measurements (when applicable) are obtained utilizing NASCET
criteria, using the distal internal carotid diameter as the
denominator. CTA of the neck was performed in neutral position and
extension due to the patient's worsening syncope with extension.
CONTRAST:  60mL OMNIPAQUE IOHEXOL 350 MG/ML SOLN, 60mL OMNIPAQUE
IOHEXOL 350 MG/ML SOLN

[Series 8: cta head · axial · 0.50mm/px · z∈[-78,+38]mm · 2 of 174 slices shown]
[im 58/174  soft-tissue]
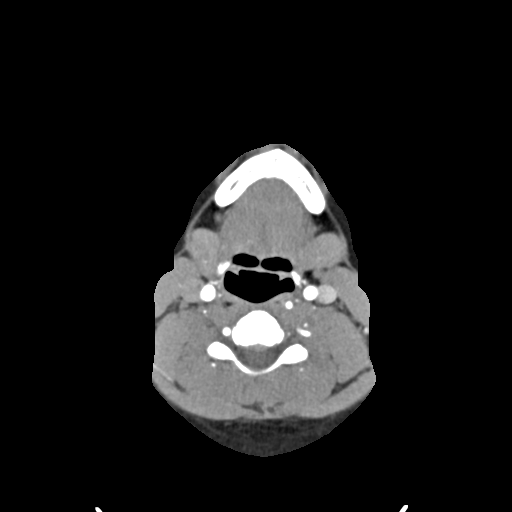
[im 116/174  bone]
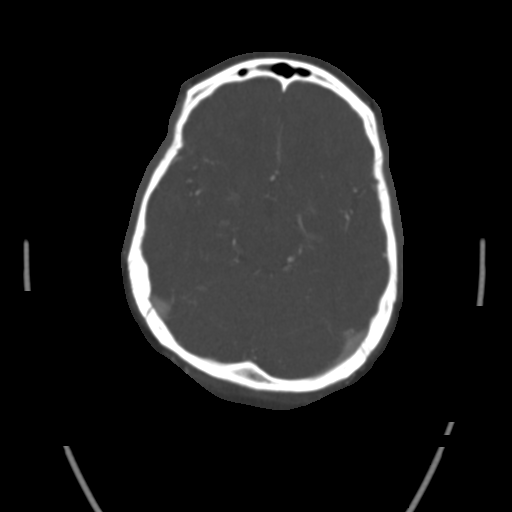

[Series 10: ax thin · axial · 0.35mm/px · z∈[-165,+52]mm · 5 of 335 slices shown]
[im 56/335  soft-tissue]
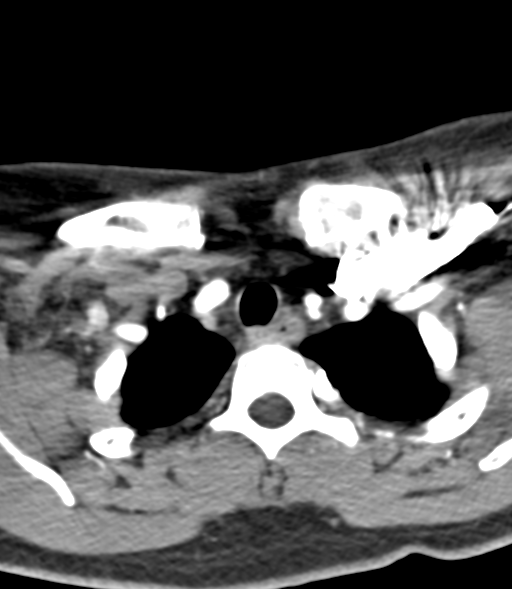
[im 112/335  soft-tissue]
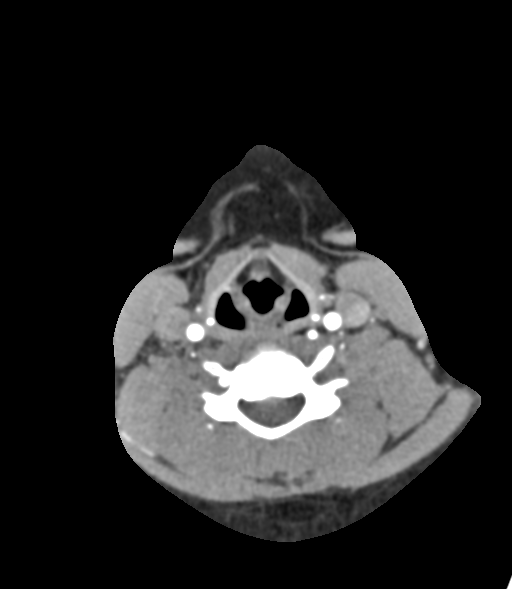
[im 168/335  soft-tissue]
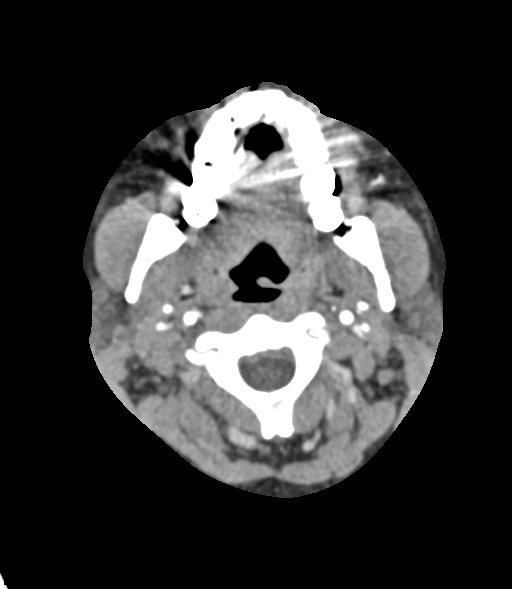
[im 223/335  soft-tissue]
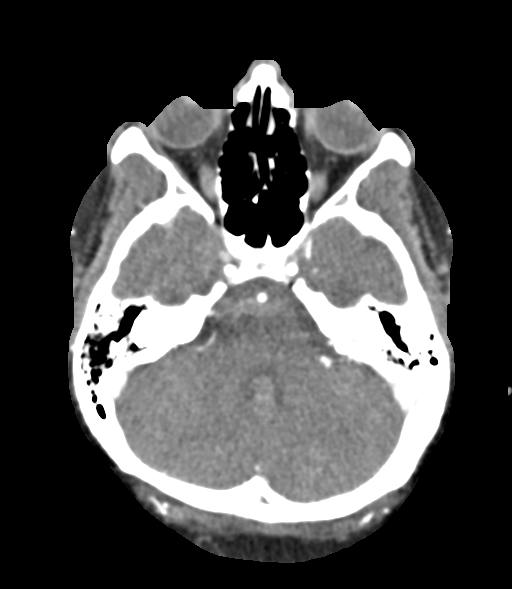
[im 279/335  soft-tissue]
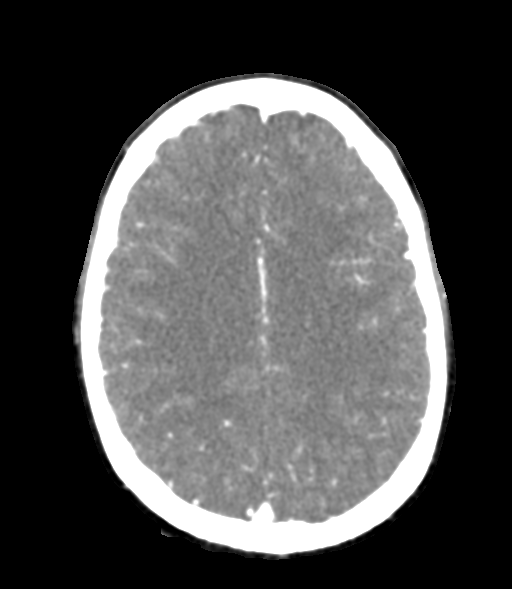

[7 of 33 positions shown; findings below may reference images not displayed]

FINDINGS: CT HEAD FINDINGS

Brain: No evidence of acute large vascular territory infarct.
Infarction, hemorrhage, hydrocephalus, extra-axial collection or
mass lesion/mass effect.

Vascular: See below.

Skull: No acute fracture.

Sinuses: Visualized sinuses are clear.

Orbits: No acute findings in the visualized orbits.

Review of the MIP images confirms the above findings

CTA NECK FINDINGS

Aortic arch: Great vessel origins are patent.

Right carotid system: No evidence of dissection, stenosis (50% or
greater) or occlusion on either neutral or extension imaging. The
left.

Left carotid system: No evidence of dissection, stenosis (50% or
greater) or occlusion on either neutral or extension imaging.

Vertebral arteries: Codominant. No evidence of dissection, stenosis
(50% or greater) or occlusion. The left vertebral artery enters the
left transverse foramen high at the C3 level.

Skeleton: Mild degenerative change.

Other neck: No evidence of acute abnormality. Reversal of the normal
cervical lordosis.

Upper chest: Visualized lung apices are clear.

Review of the MIP images confirms the above findings

CTA HEAD FINDINGS

Anterior circulation: Bilateral intracranial ICAs, MCAs, and ACAs
are patent without proximal hemodynamically significant stenosis.
Limited distal vessel evaluation due to venous contamination.

Posterior circulation: Bilateral intradural vertebral arteries,
basilar artery, and posterior cerebral arteries are patent without
proximal hemodynamically significant stenosis.

Venous sinuses: As permitted by contrast timing, patent.

Review of the MIP images confirms the above findings
IMPRESSION: CT head

No evidence of acute intracranial abnormality.

CTA head:

No large vessel occlusion or proximal hemodynamically significant
stenosis. Limited evaluation due to venous contamination.

CTA neck:

1. No evidence of significant (greater than 50%) stenosis with
neutral and extension positioning.
2. The left vertebral artery enters the left transverse foramen high
at the C3 level.

## 2021-04-05 IMAGING — CT CT ANGIO HEAD
3 of 8 series · 10 of 36 positions shown · IV contrast (omnipaque)
Comparison: None.

CLINICAL DATA: Syncope, recurrent Per neurology, CTA head and neck
both with normal positioning and with maximal neck extension as this
has provoked patient's syncope.

EXAM:
CT ANGIOGRAPHY HEAD AND NECK
TECHNIQUE: Multidetector CT imaging of the head and neck was performed using
the standard protocol during bolus administration of intravenous
contrast. Multiplanar CT image reconstructions and MIPs were
obtained to evaluate the vascular anatomy. Carotid stenosis
measurements (when applicable) are obtained utilizing NASCET
criteria, using the distal internal carotid diameter as the
denominator. CTA of the neck was performed in neutral position and
extension due to the patient's worsening syncope with extension.
CONTRAST:  60mL OMNIPAQUE IOHEXOL 350 MG/ML SOLN, 60mL OMNIPAQUE
IOHEXOL 350 MG/ML SOLN

[Series 5: cta neck in extension · axial · 0.36mm/px · z∈[-118,+53]mm · 6 of 482 slices shown]
[im 69/482  soft-tissue]
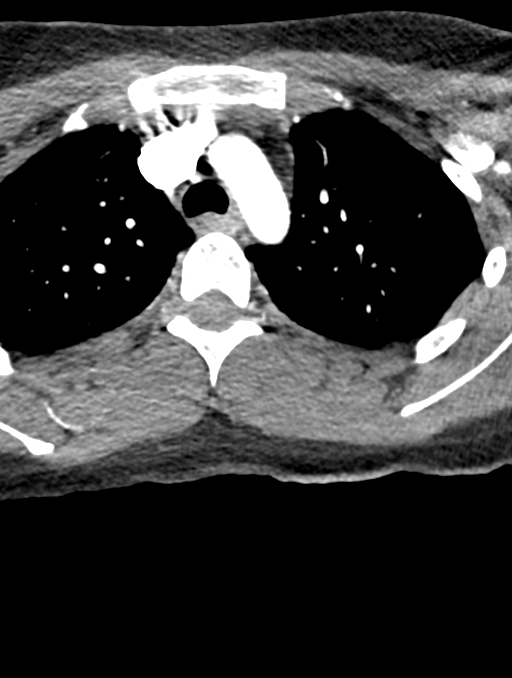
[im 138/482  bone]
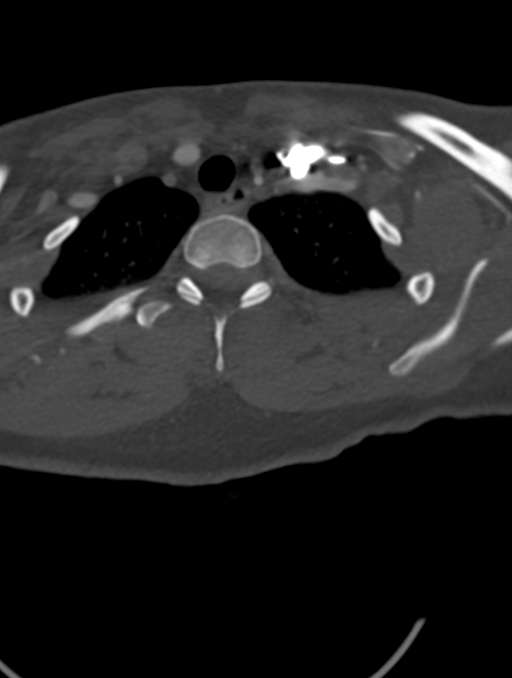
[im 207/482  soft-tissue]
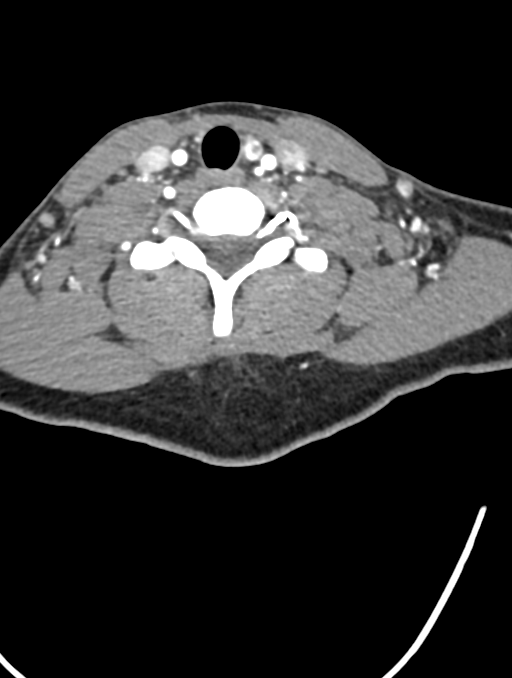
[im 275/482  bone]
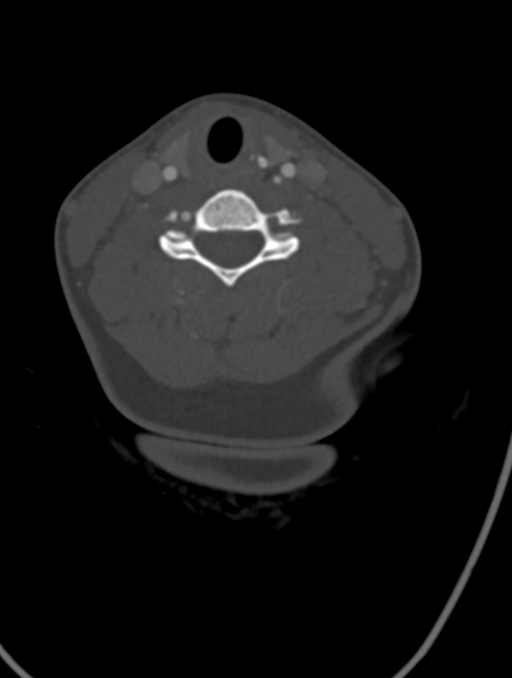
[im 344/482  soft-tissue]
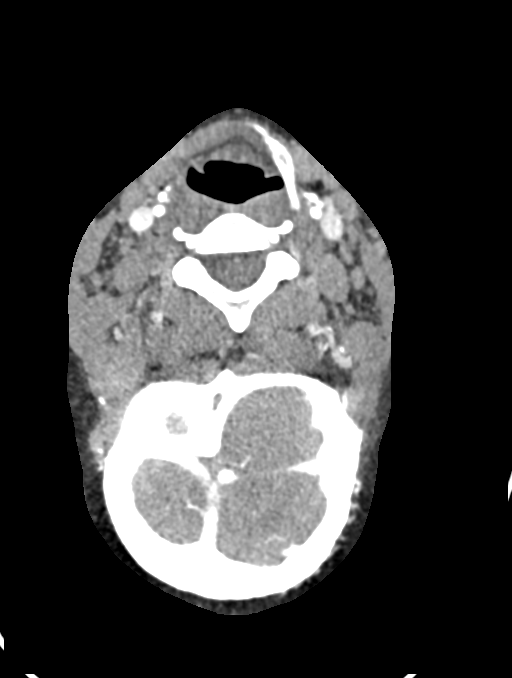
[im 413/482  bone]
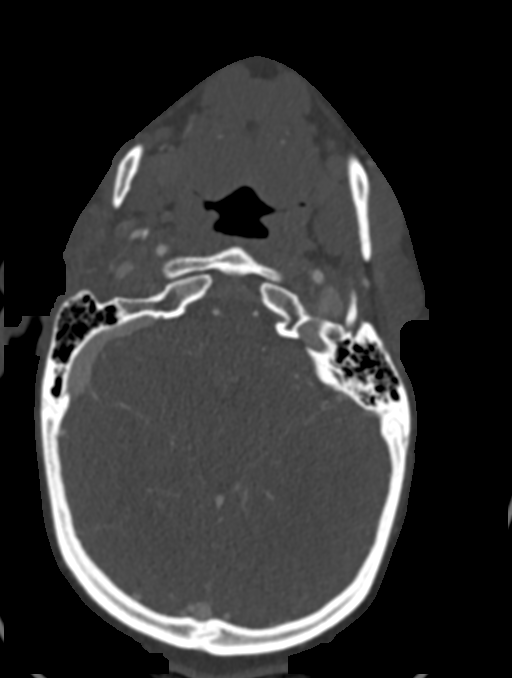

[Series 6: ax thin in extension · axial · 0.36mm/px · z∈[-72,+8]mm · 2 of 241 slices shown]
[im 81/241  soft-tissue]
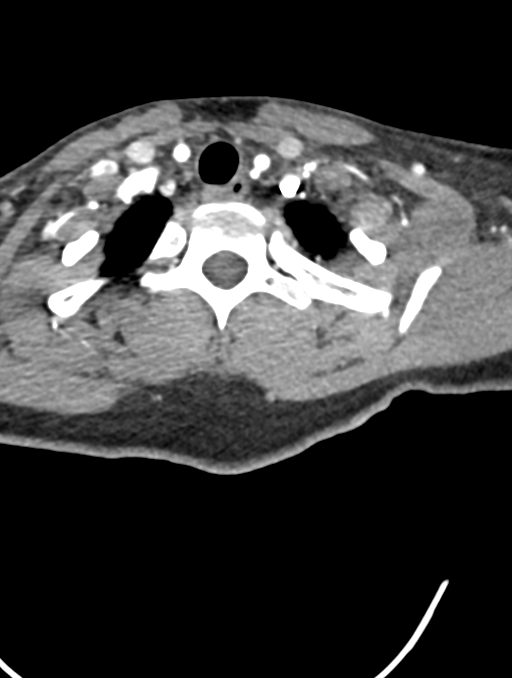
[im 161/241  soft-tissue]
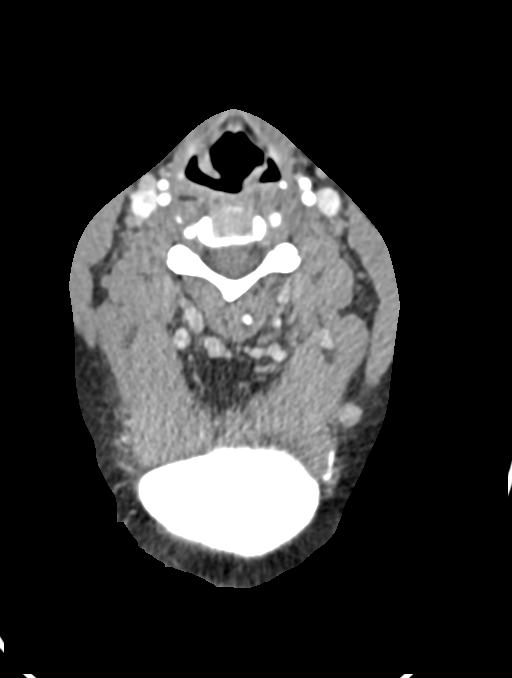

[Series 8: sag thin in extension · sagittal · 0.47mm/px · 2 of 183 slices shown]
[im 28/183  soft-tissue]
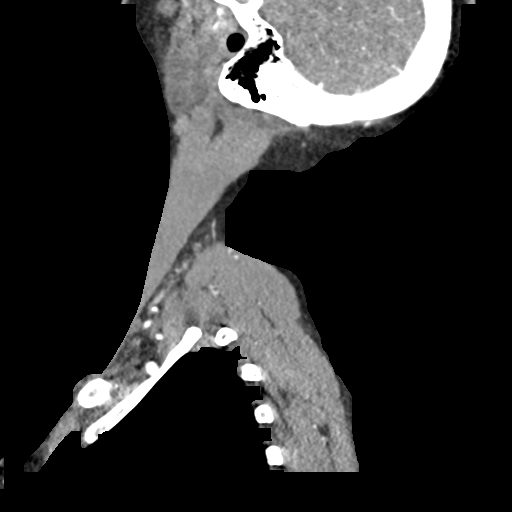
[im 156/183  soft-tissue]
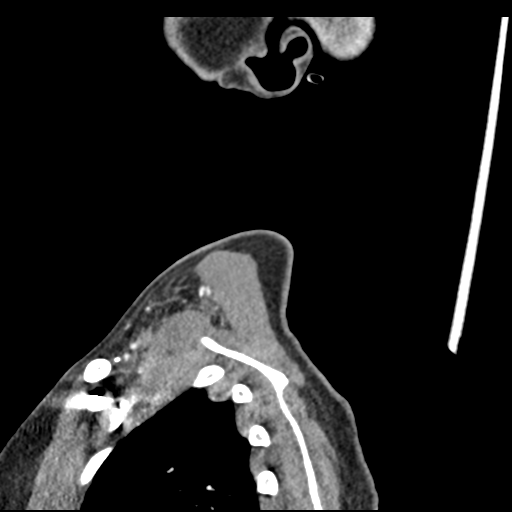

[10 of 36 positions shown; findings below may reference images not displayed]

FINDINGS: CT HEAD FINDINGS

Brain: No evidence of acute large vascular territory infarct.
Infarction, hemorrhage, hydrocephalus, extra-axial collection or
mass lesion/mass effect.

Vascular: See below.

Skull: No acute fracture.

Sinuses: Visualized sinuses are clear.

Orbits: No acute findings in the visualized orbits.

Review of the MIP images confirms the above findings

CTA NECK FINDINGS

Aortic arch: Great vessel origins are patent.

Right carotid system: No evidence of dissection, stenosis (50% or
greater) or occlusion on either neutral or extension imaging. The
left.

Left carotid system: No evidence of dissection, stenosis (50% or
greater) or occlusion on either neutral or extension imaging.

Vertebral arteries: Codominant. No evidence of dissection, stenosis
(50% or greater) or occlusion. The left vertebral artery enters the
left transverse foramen high at the C3 level.

Skeleton: Mild degenerative change.

Other neck: No evidence of acute abnormality. Reversal of the normal
cervical lordosis.

Upper chest: Visualized lung apices are clear.

Review of the MIP images confirms the above findings

CTA HEAD FINDINGS

Anterior circulation: Bilateral intracranial ICAs, MCAs, and ACAs
are patent without proximal hemodynamically significant stenosis.
Limited distal vessel evaluation due to venous contamination.

Posterior circulation: Bilateral intradural vertebral arteries,
basilar artery, and posterior cerebral arteries are patent without
proximal hemodynamically significant stenosis.

Venous sinuses: As permitted by contrast timing, patent.

Review of the MIP images confirms the above findings
IMPRESSION: CT head

No evidence of acute intracranial abnormality.

CTA head:

No large vessel occlusion or proximal hemodynamically significant
stenosis. Limited evaluation due to venous contamination.

CTA neck:

1. No evidence of significant (greater than 50%) stenosis with
neutral and extension positioning.
2. The left vertebral artery enters the left transverse foramen high
at the C3 level.

## 2021-04-05 IMAGING — DX DG CHEST 2V
2 series · 2 of 2 positions shown · non-contrast
Comparison: None.

CLINICAL DATA: Recurrent syncope

EXAM:
CHEST - 2 VIEW

[chest pa]
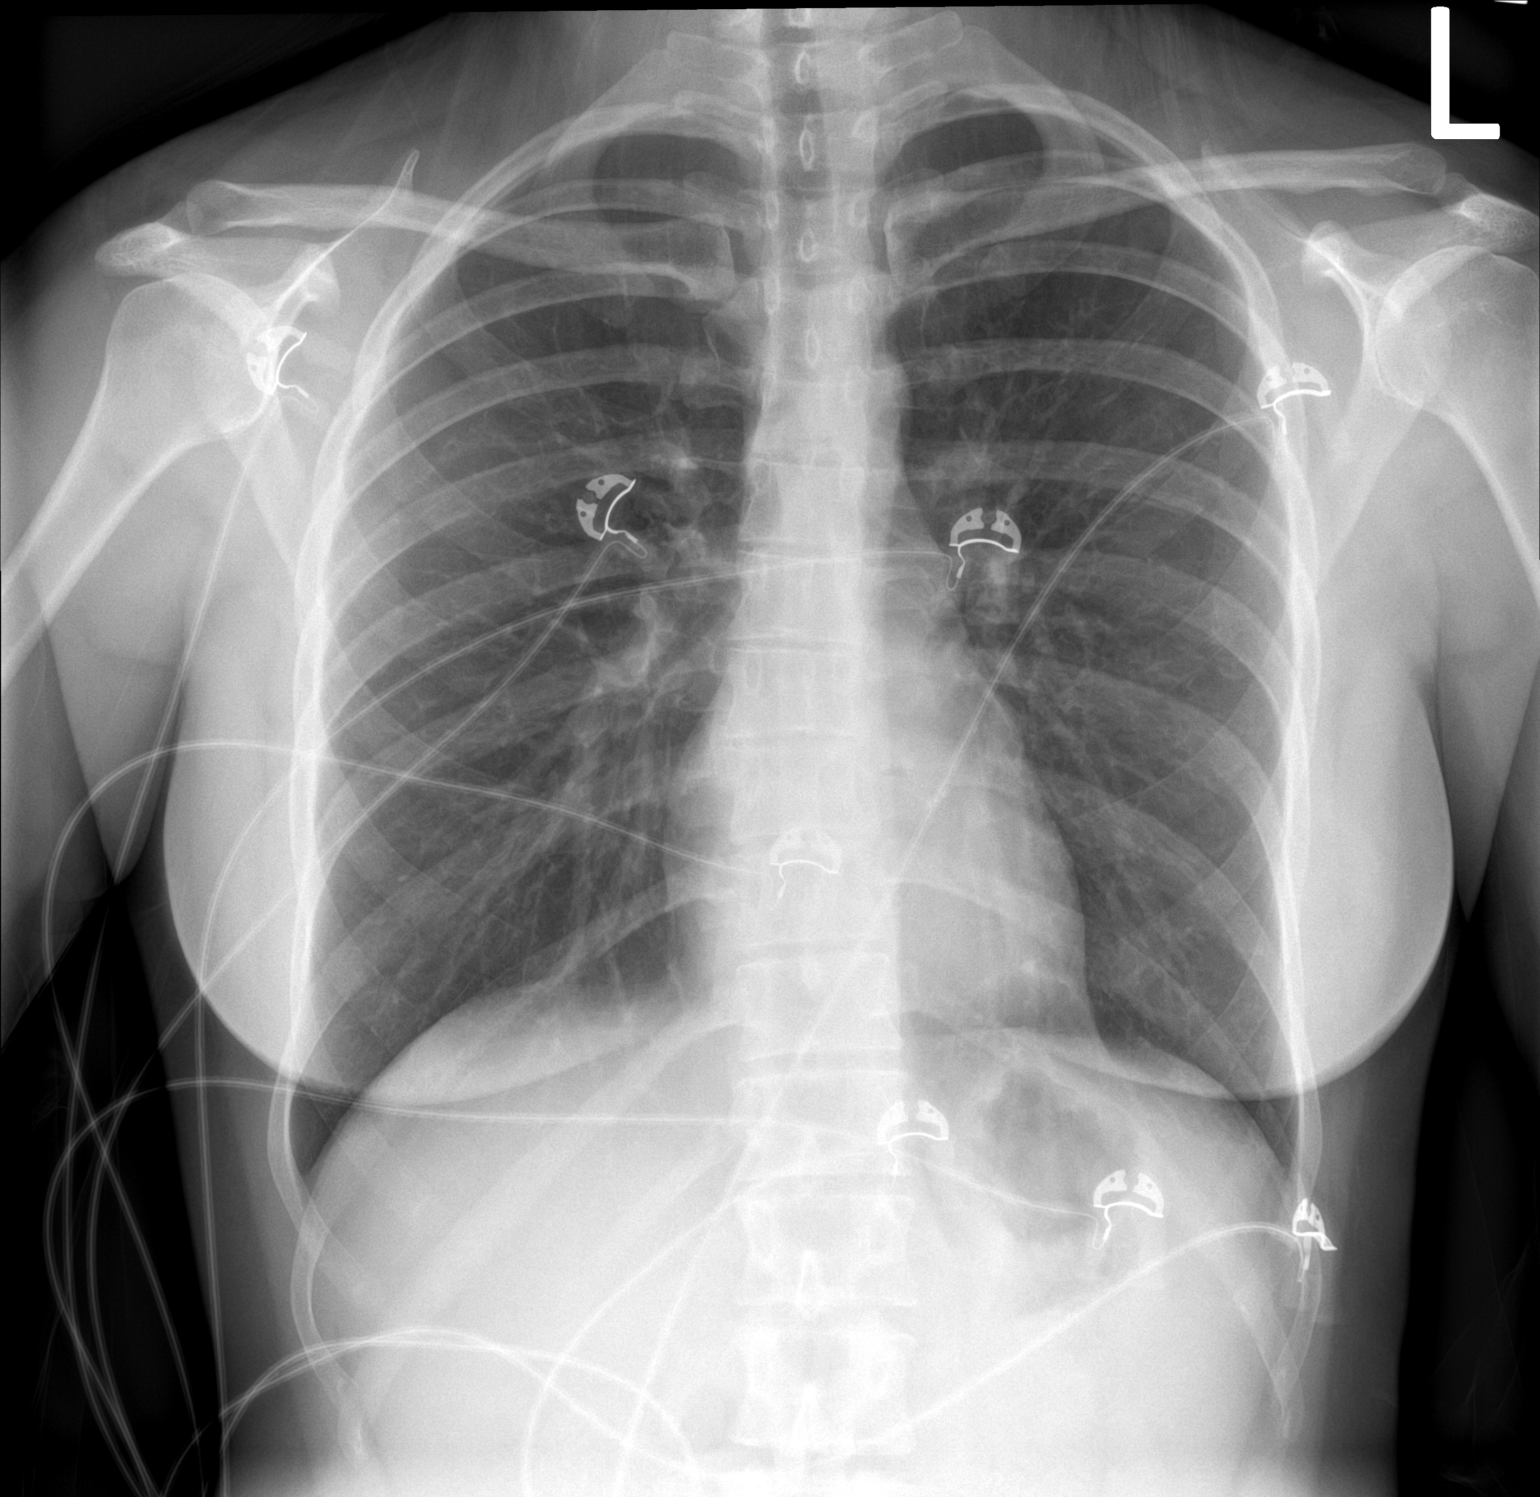

[chest lat]
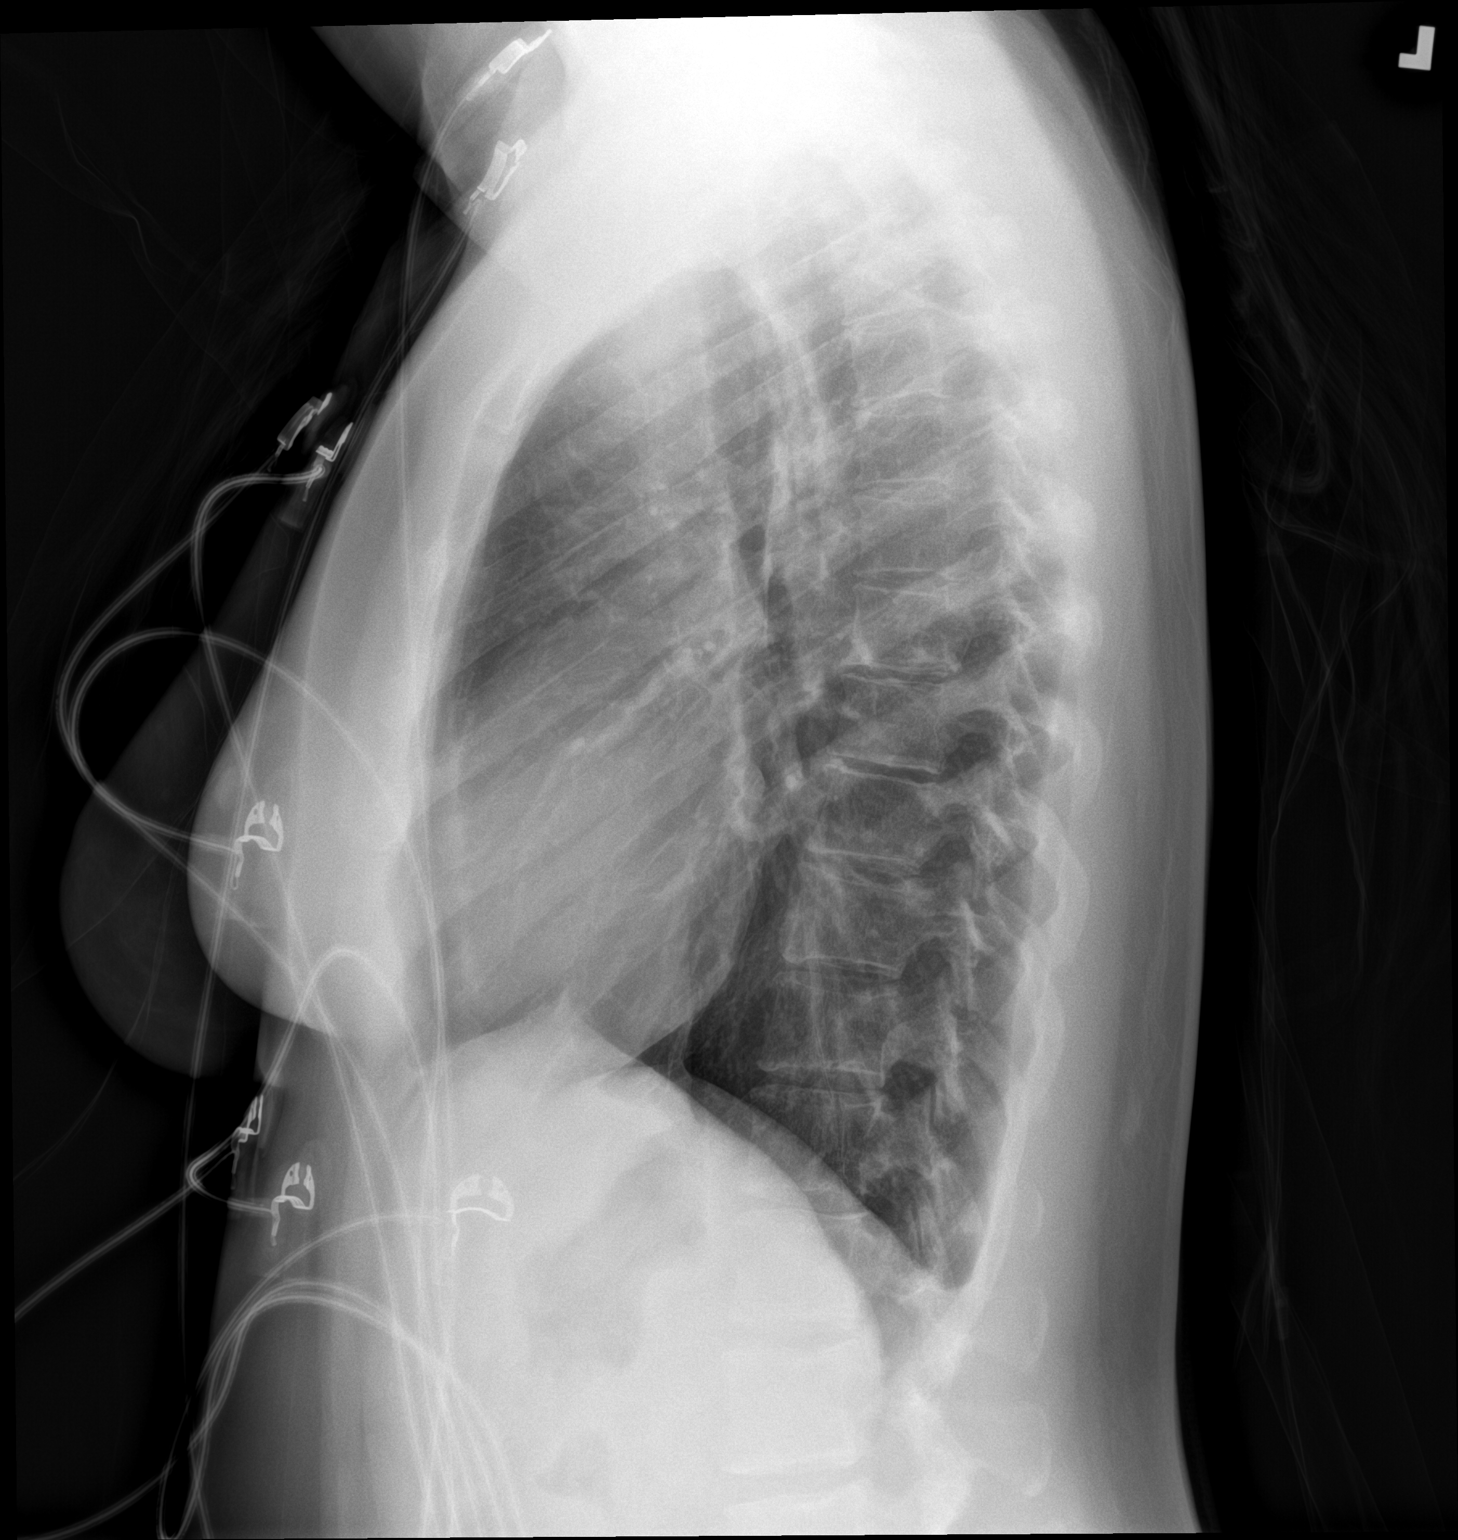

[2 of 2 positions shown; findings below may reference images not displayed]

FINDINGS: The heart size and mediastinal contours are within normal limits.
Both lungs are clear. The visualized skeletal structures are
unremarkable.
IMPRESSION: Normal study.

## 2021-04-05 IMAGING — CT CT ANGIO NECK
3 of 8 series · 10 of 36 positions shown · IV contrast (APPLIED)
Comparison: None.

CLINICAL DATA: Syncope, recurrent Per neurology, CTA head and neck
both with normal positioning and with maximal neck extension as this
has provoked patient's syncope.

EXAM:
CT ANGIOGRAPHY HEAD AND NECK
TECHNIQUE: Multidetector CT imaging of the head and neck was performed using
the standard protocol during bolus administration of intravenous
contrast. Multiplanar CT image reconstructions and MIPs were
obtained to evaluate the vascular anatomy. Carotid stenosis
measurements (when applicable) are obtained utilizing NASCET
criteria, using the distal internal carotid diameter as the
denominator. CTA of the neck was performed in neutral position and
extension due to the patient's worsening syncope with extension.
CONTRAST:  60mL OMNIPAQUE IOHEXOL 350 MG/ML SOLN, 60mL OMNIPAQUE
IOHEXOL 350 MG/ML SOLN

[Series 5: cta neck in extension · axial · 0.36mm/px · z∈[-118,+53]mm · 6 of 482 slices shown]
[im 69/482  soft-tissue]
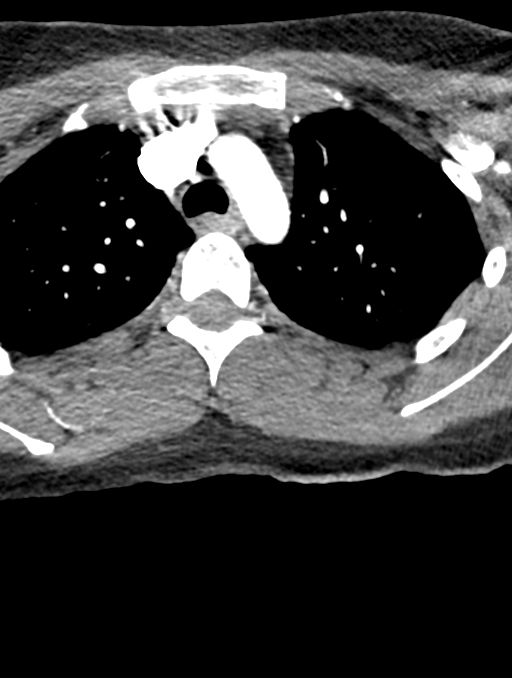
[im 138/482  bone]
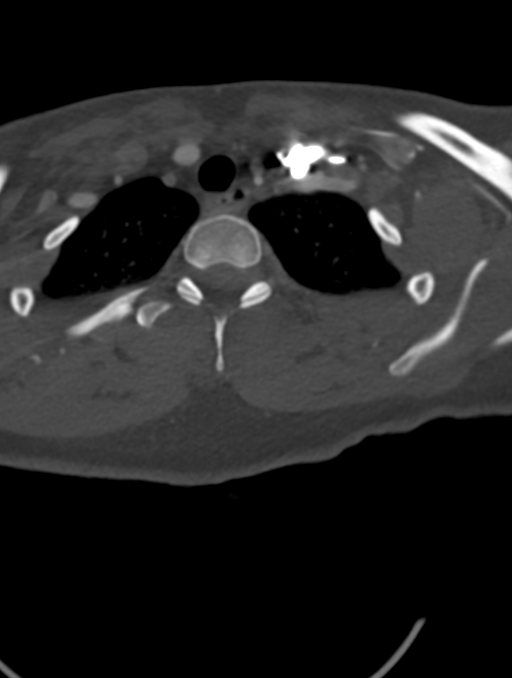
[im 207/482  soft-tissue]
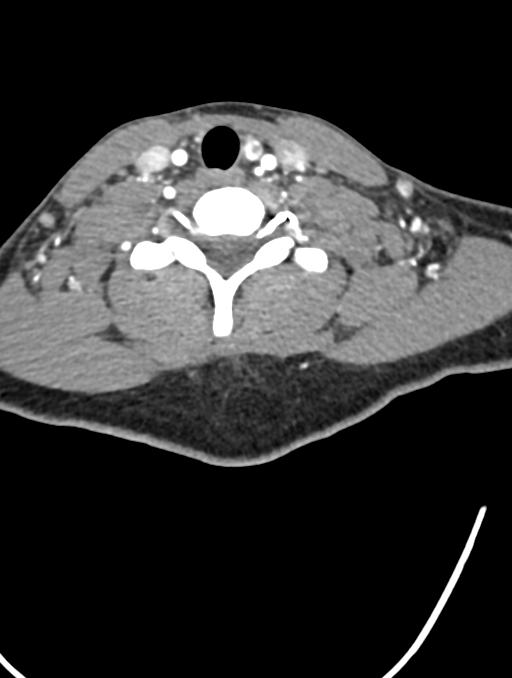
[im 275/482  bone]
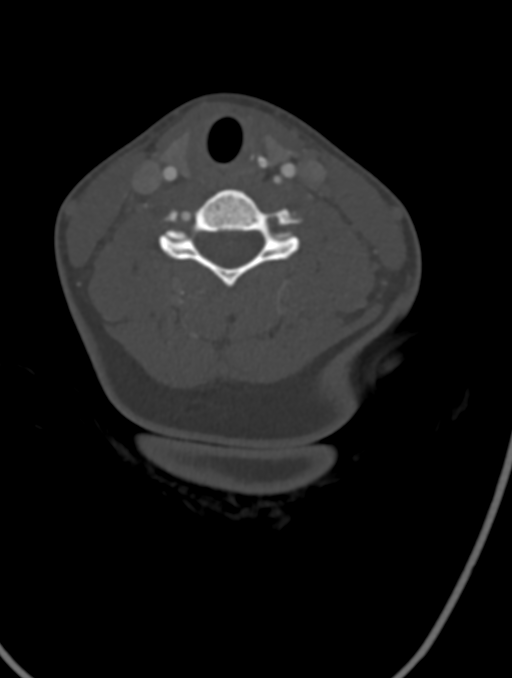
[im 344/482  soft-tissue]
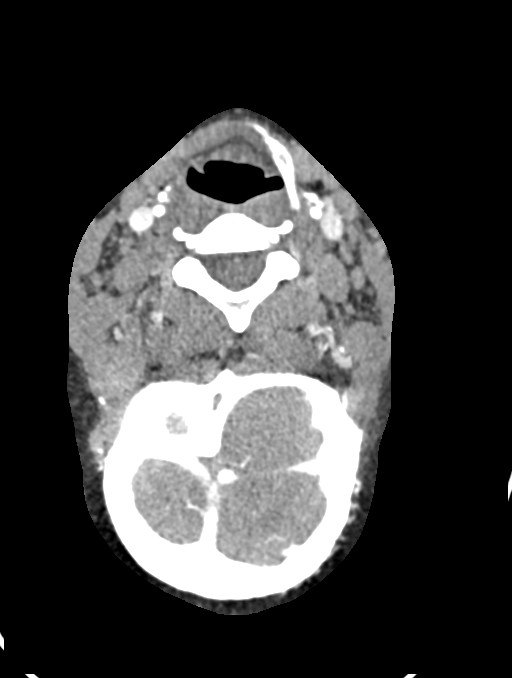
[im 413/482  bone]
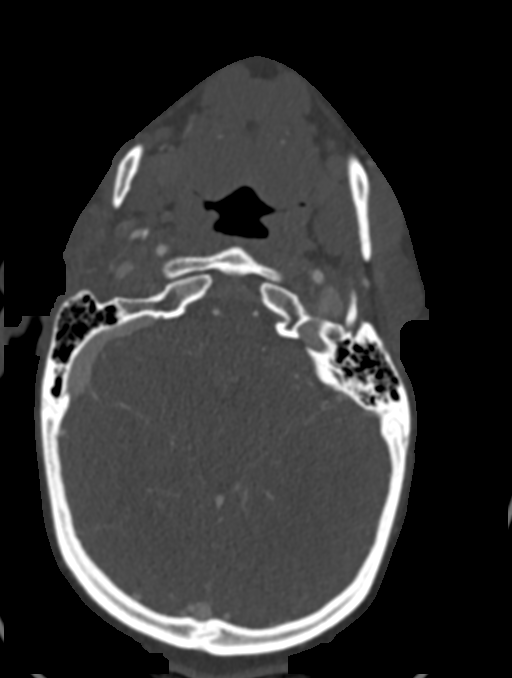

[Series 6: ax thin in extension · axial · 0.36mm/px · z∈[-72,+8]mm · 2 of 241 slices shown]
[im 81/241  soft-tissue]
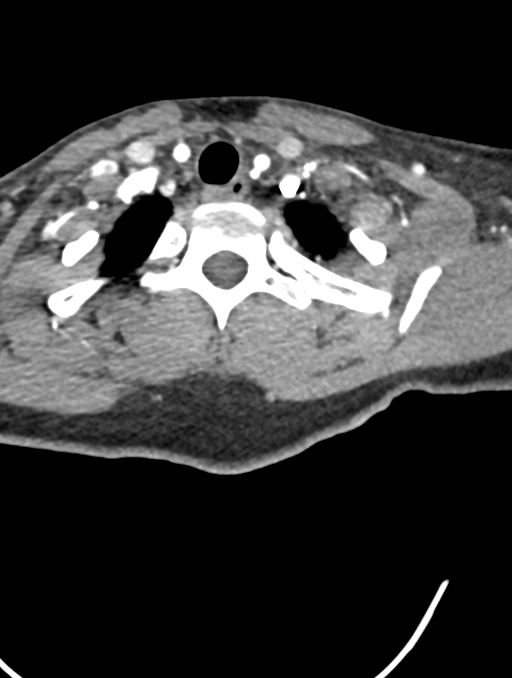
[im 161/241  soft-tissue]
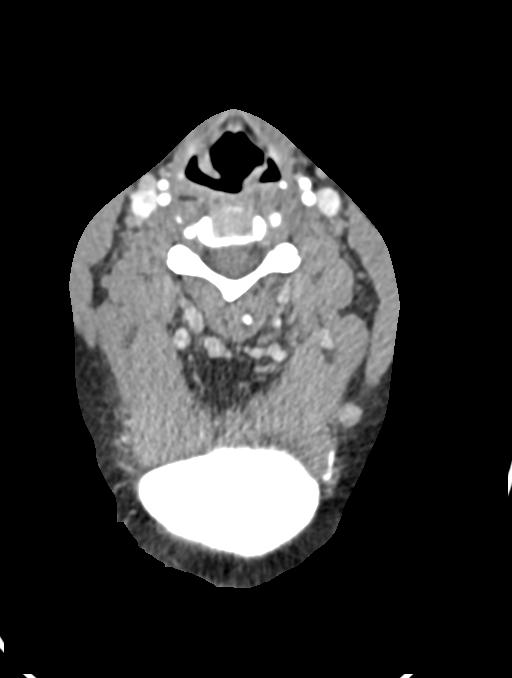

[Series 8: sag thin in extension · sagittal · 0.47mm/px · 2 of 183 slices shown]
[im 28/183  soft-tissue]
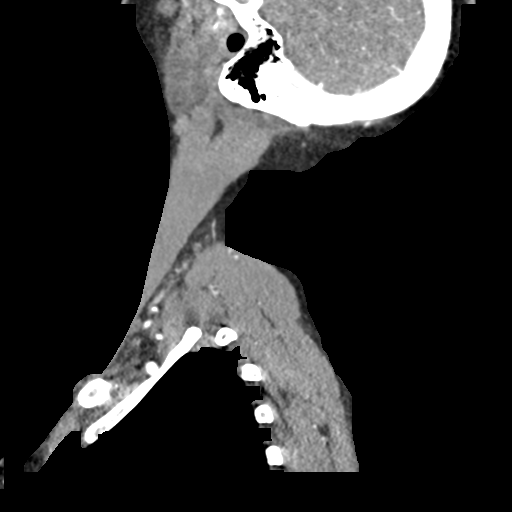
[im 156/183  soft-tissue]
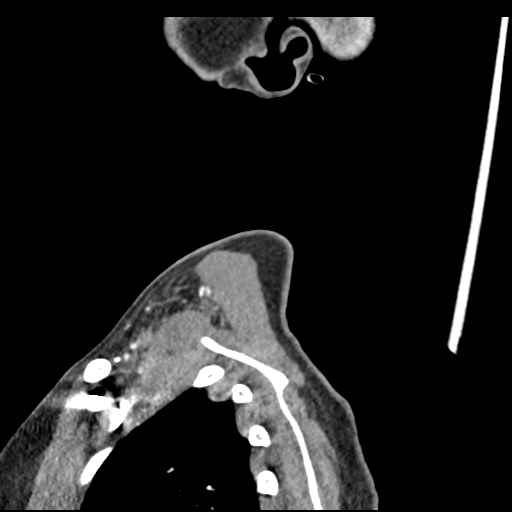

[10 of 36 positions shown; findings below may reference images not displayed]

FINDINGS: CT HEAD FINDINGS

Brain: No evidence of acute large vascular territory infarct.
Infarction, hemorrhage, hydrocephalus, extra-axial collection or
mass lesion/mass effect.

Vascular: See below.

Skull: No acute fracture.

Sinuses: Visualized sinuses are clear.

Orbits: No acute findings in the visualized orbits.

Review of the MIP images confirms the above findings

CTA NECK FINDINGS

Aortic arch: Great vessel origins are patent.

Right carotid system: No evidence of dissection, stenosis (50% or
greater) or occlusion on either neutral or extension imaging. The
left.

Left carotid system: No evidence of dissection, stenosis (50% or
greater) or occlusion on either neutral or extension imaging.

Vertebral arteries: Codominant. No evidence of dissection, stenosis
(50% or greater) or occlusion. The left vertebral artery enters the
left transverse foramen high at the C3 level.

Skeleton: Mild degenerative change.

Other neck: No evidence of acute abnormality. Reversal of the normal
cervical lordosis.

Upper chest: Visualized lung apices are clear.

Review of the MIP images confirms the above findings

CTA HEAD FINDINGS

Anterior circulation: Bilateral intracranial ICAs, MCAs, and ACAs
are patent without proximal hemodynamically significant stenosis.
Limited distal vessel evaluation due to venous contamination.

Posterior circulation: Bilateral intradural vertebral arteries,
basilar artery, and posterior cerebral arteries are patent without
proximal hemodynamically significant stenosis.

Venous sinuses: As permitted by contrast timing, patent.

Review of the MIP images confirms the above findings
IMPRESSION: CT head

No evidence of acute intracranial abnormality.

CTA head:

No large vessel occlusion or proximal hemodynamically significant
stenosis. Limited evaluation due to venous contamination.

CTA neck:

1. No evidence of significant (greater than 50%) stenosis with
neutral and extension positioning.
2. The left vertebral artery enters the left transverse foramen high
at the C3 level.

## 2021-04-05 MED ORDER — IOHEXOL 350 MG/ML SOLN
60.0000 mL | Freq: Once | INTRAVENOUS | Status: AC | PRN
  Administered 2021-04-05: 60 mL via INTRAVENOUS

## 2021-04-05 NOTE — ED Provider Notes (Signed)
[facility] EMERGENCY DEPT Provider Note   CSN: [id number] Arrival date & time: 04/05/21  1115     History Chief Complaint  Patient presents with   Loss of Consciousness    Bonnye Halle is a 25 y.o. female.  The history is provided by the patient and medical records. No language interpreter was used.  Loss of Consciousness Episode history:  Multiple Most recent episode:  Today Duration:  30 seconds Timing:  Intermittent Progression:  Waxing and waning Chronicity:  New (3 months) Context comment:  With neck extension at times Witnessed: yes   Relieved by:  Nothing Worsened by:  Posture Ineffective treatments:  None tried Associated symptoms: dizziness, focal sensory loss, headaches (neck and occipetal at times) and visual change   Associated symptoms: no anxiety, no chest pain, no diaphoresis, no difficulty breathing, no fever, no malaise/fatigue, no nausea, no palpitations, no recent fall, no recent injury, no seizures, no shortness of breath, no vomiting and no weakness       Past Medical History:  Diagnosis Date   History of concussion     There are no problems to display for this patient.   History reviewed. No pertinent surgical history.   OB History     Gravida  1   Para      Term      Preterm      AB      Living         SAB      IAB      Ectopic      Multiple      Live Births              History reviewed. No pertinent family history.  Social History   Tobacco Use   Smoking status: Some Days    Passive exposure: Current   Smokeless tobacco: Never  Vaping Use   Vaping Use: Some days  Substance Use Topics   Alcohol use: Yes    Comment: occassional   Drug use: Yes    Types: Marijuana    Home Medications Prior to Admission medications   Medication Sig Start Date End Date Taking? Authorizing Provider  metroNIDAZOLE (FLAGYL) 500 MG tablet Take 1 tablet (500 mg total) by mouth 2 (two) times daily. 08/11/20    Robinson, [place] N, PA-C  promethazine (PHENERGAN) 12.5 MG tablet Take 1 tablet (12.5 mg total) by mouth every 6 (six) hours as needed for nausea or vomiting. 06/21/16   Lattie Haw, MD    Allergies    Tylenol [acetaminophen]  Review of Systems   Review of Systems  Constitutional:  Negative for diaphoresis, fatigue, fever and malaise/fatigue.  HENT:  Negative for congestion.   Eyes:  Positive for visual disturbance.  Respiratory:  Negative for cough, chest tightness and shortness of breath.   Cardiovascular:  Positive for syncope. Negative for chest pain and palpitations.  Gastrointestinal:  Negative for abdominal pain, nausea and vomiting.  Genitourinary:  Negative for dysuria.  Musculoskeletal:  Positive for neck pain. Negative for back pain and neck stiffness.  Skin:  Negative for rash and wound.  Neurological:  Positive for dizziness, syncope, light-headedness, numbness and headaches (neck and occipetal at times). Negative for seizures, facial asymmetry, speech difficulty and weakness.  Psychiatric/Behavioral:  Negative for agitation.   All other systems reviewed and are negative.  Physical Exam Updated Vital Signs BP 139/86 (BP Location: Right Arm)   Pulse 76   Temp 98.4 F (36.9 C) (Oral)  Resp 19   Ht  (1.6 m)   Wt 59 kg   LMP 04/01/2021   SpO2 100%   Breastfeeding No   BMI 23.03 kg/m   Physical Exam Vitals and nursing note reviewed.  Constitutional:      General: She is not in acute distress.    Appearance: She is well-developed. She is not ill-appearing, toxic-appearing or diaphoretic.  HENT:     Head: Normocephalic and atraumatic.     Nose: Nose normal. No congestion or rhinorrhea.     Mouth/Throat:     Mouth: Mucous membranes are moist.     Pharynx: No oropharyngeal exudate or posterior oropharyngeal erythema.  Eyes:     Extraocular Movements: Extraocular movements intact.     Conjunctiva/sclera: Conjunctivae normal.     Pupils: Pupils are  equal, round, and reactive to light.  Neck:     Vascular: No carotid bruit.  Cardiovascular:     Rate and Rhythm: Normal rate and regular rhythm.     Pulses: Normal pulses.     Heart sounds: No murmur heard. Pulmonary:     Effort: Pulmonary effort is normal. No respiratory distress.     Breath sounds: Normal breath sounds. No wheezing, rhonchi or rales.  Chest:     Chest wall: No tenderness.  Abdominal:     General: Abdomen is flat.     Palpations: Abdomen is soft.     Tenderness: There is no abdominal tenderness. There is no right CVA tenderness, left CVA tenderness, guarding or rebound.  Musculoskeletal:        General: No tenderness.     Cervical back: Neck supple. No tenderness.  Skin:    General: Skin is warm and dry.     Capillary Refill: Capillary refill takes less than 2 seconds.     Findings: No erythema.  Neurological:     General: No focal deficit present.     Mental Status: She is alert. Mental status is at baseline.     Cranial Nerves: No cranial nerve deficit.     Sensory: No sensory deficit.     Motor: No weakness.  Psychiatric:        Mood and Affect: Mood normal.    ED Results / Procedures / Treatments   Labs (all labs ordered are listed, but only abnormal results are displayed) Labs Reviewed  BASIC METABOLIC PANEL - Abnormal; Notable for the following components:      Result Value   CO2 21 (*)    All other components within normal limits  URINALYSIS, ROUTINE W REFLEX MICROSCOPIC - Abnormal; Notable for the following components:   APPearance HAZY (*)    Nitrite POSITIVE (*)    Leukocytes,Ua LARGE (*)    Bacteria, UA RARE (*)    All other components within normal limits  HEPATIC FUNCTION PANEL - Abnormal; Notable for the following components:   AST 13 (*)    Alkaline Phosphatase 35 (*)    All other components within normal limits  RESP PANEL BY RT-PCR (FLU A&B, COVID) ARPGX2  URINE CULTURE  CBC  PREGNANCY, URINE  MAGNESIUM  TSH  PROTIME-INR   CBG MONITORING, ED    EKG None  Radiology CT Angio Head W or Wo Contrast  Result Date: 04/05/2021 CLINICAL DATA:  Syncope, recurrent Per neurology, CTA head and neck both with normal positioning and with maximal neck extension as this has provoked patient's syncope. EXAM: CT ANGIOGRAPHY HEAD AND NECK TECHNIQUE: Multidetector CT imaging of the  head and neck was performed using the standard protocol during bolus administration of intravenous contrast. Multiplanar CT image reconstructions and MIPs were obtained to evaluate the vascular anatomy. Carotid stenosis measurements (when applicable) are obtained utilizing NASCET criteria, using the distal internal carotid diameter as the denominator. CTA of the neck was performed in neutral position and extension due to the patient's worsening syncope with extension. CONTRAST:  69mL OMNIPAQUE IOHEXOL 350 MG/ML SOLN, 50mL OMNIPAQUE IOHEXOL 350 MG/ML SOLN COMPARISON:  None. FINDINGS: CT HEAD FINDINGS Brain: No evidence of acute large vascular territory infarct. Infarction, hemorrhage, hydrocephalus, extra-axial collection or mass lesion/mass effect. Vascular: See below. Skull: No acute fracture. Sinuses: Visualized sinuses are clear. Orbits: No acute findings in the visualized orbits. Review of the MIP images confirms the above findings CTA NECK FINDINGS Aortic arch: Great vessel origins are patent. Right carotid system: No evidence of dissection, stenosis (50% or greater) or occlusion on either neutral or extension imaging. The left. Left carotid system: No evidence of dissection, stenosis (50% or greater) or occlusion on either neutral or extension imaging. Vertebral arteries: Codominant. No evidence of dissection, stenosis (50% or greater) or occlusion. The left vertebral artery enters the left transverse foramen high at the C3 level. Skeleton: Mild degenerative change. Other neck: No evidence of acute abnormality. Reversal of the normal cervical lordosis. Upper  chest: Visualized lung apices are clear. Review of the MIP images confirms the above findings CTA HEAD FINDINGS Anterior circulation: Bilateral intracranial ICAs, MCAs, and ACAs are patent without proximal hemodynamically significant stenosis. Limited distal vessel evaluation due to venous contamination. Posterior circulation: Bilateral intradural vertebral arteries, basilar artery, and posterior cerebral arteries are patent without proximal hemodynamically significant stenosis. Venous sinuses: As permitted by contrast timing, patent. Review of the MIP images confirms the above findings IMPRESSION: CT head No evidence of acute intracranial abnormality. CTA head: No large vessel occlusion or proximal hemodynamically significant stenosis. Limited evaluation due to venous contamination. CTA neck: 1. No evidence of significant (greater than 50%) stenosis with neutral and extension positioning. 2. The left vertebral artery enters the left transverse foramen high at the C3 level. Electronically Signed   By: Feliberto Harts M.D.   On: 04/05/2021 20:14   DG Chest 2 View  Result Date: 04/05/2021 CLINICAL DATA:  Recurrent syncope EXAM: CHEST - 2 VIEW COMPARISON:  None. FINDINGS: The heart size and mediastinal contours are within normal limits. Both lungs are clear. The visualized skeletal structures are unremarkable. IMPRESSION: Normal study. Electronically Signed   By: Charlett Nose M.D.   On: 04/05/2021 18:47   CT ANGIO NECK W OR WO CONTRAST  Result Date: 04/05/2021 CLINICAL DATA:  Syncope, recurrent Per neurology, CTA head and neck both with normal positioning and with maximal neck extension as this has provoked patient's syncope. EXAM: CT ANGIOGRAPHY HEAD AND NECK TECHNIQUE: Multidetector CT imaging of the head and neck was performed using the standard protocol during bolus administration of intravenous contrast. Multiplanar CT image reconstructions and MIPs were obtained to evaluate the vascular anatomy.  Carotid stenosis measurements (when applicable) are obtained utilizing NASCET criteria, using the distal internal carotid diameter as the denominator. CTA of the neck was performed in neutral position and extension due to the patient's worsening syncope with extension. CONTRAST:  45mL OMNIPAQUE IOHEXOL 350 MG/ML SOLN, 61mL OMNIPAQUE IOHEXOL 350 MG/ML SOLN COMPARISON:  None. FINDINGS: CT HEAD FINDINGS Brain: No evidence of acute large vascular territory infarct. Infarction, hemorrhage, hydrocephalus, extra-axial collection or mass lesion/mass effect. Vascular: See below. Skull:  No acute fracture. Sinuses: Visualized sinuses are clear. Orbits: No acute findings in the visualized orbits. Review of the MIP images confirms the above findings CTA NECK FINDINGS Aortic arch: Great vessel origins are patent. Right carotid system: No evidence of dissection, stenosis (50% or greater) or occlusion on either neutral or extension imaging. The left. Left carotid system: No evidence of dissection, stenosis (50% or greater) or occlusion on either neutral or extension imaging. Vertebral arteries: Codominant. No evidence of dissection, stenosis (50% or greater) or occlusion. The left vertebral artery enters the left transverse foramen high at the C3 level. Skeleton: Mild degenerative change. Other neck: No evidence of acute abnormality. Reversal of the normal cervical lordosis. Upper chest: Visualized lung apices are clear. Review of the MIP images confirms the above findings CTA HEAD FINDINGS Anterior circulation: Bilateral intracranial ICAs, MCAs, and ACAs are patent without proximal hemodynamically significant stenosis. Limited distal vessel evaluation due to venous contamination. Posterior circulation: Bilateral intradural vertebral arteries, basilar artery, and posterior cerebral arteries are patent without proximal hemodynamically significant stenosis. Venous sinuses: As permitted by contrast timing, patent. Review of the MIP  images confirms the above findings IMPRESSION: CT head No evidence of acute intracranial abnormality. CTA head: No large vessel occlusion or proximal hemodynamically significant stenosis. Limited evaluation due to venous contamination. CTA neck: 1. No evidence of significant (greater than 50%) stenosis with neutral and extension positioning. 2. The left vertebral artery enters the left transverse foramen high at the C3 level. Electronically Signed   By: Feliberto Harts M.D.   On: 04/05/2021 20:14   CT Angio Neck W and/or Wo Contrast  Result Date: 04/05/2021 CLINICAL DATA:  Syncope, recurrent Per neurology, CTA head and neck both with normal positioning and with maximal neck extension as this has provoked patient's syncope. EXAM: CT ANGIOGRAPHY HEAD AND NECK TECHNIQUE: Multidetector CT imaging of the head and neck was performed using the standard protocol during bolus administration of intravenous contrast. Multiplanar CT image reconstructions and MIPs were obtained to evaluate the vascular anatomy. Carotid stenosis measurements (when applicable) are obtained utilizing NASCET criteria, using the distal internal carotid diameter as the denominator. CTA of the neck was performed in neutral position and extension due to the patient's worsening syncope with extension. CONTRAST:  51mL OMNIPAQUE IOHEXOL 350 MG/ML SOLN, 39mL OMNIPAQUE IOHEXOL 350 MG/ML SOLN COMPARISON:  None. FINDINGS: CT HEAD FINDINGS Brain: No evidence of acute large vascular territory infarct. Infarction, hemorrhage, hydrocephalus, extra-axial collection or mass lesion/mass effect. Vascular: See below. Skull: No acute fracture. Sinuses: Visualized sinuses are clear. Orbits: No acute findings in the visualized orbits. Review of the MIP images confirms the above findings CTA NECK FINDINGS Aortic arch: Great vessel origins are patent. Right carotid system: No evidence of dissection, stenosis (50% or greater) or occlusion on either neutral or  extension imaging. The left. Left carotid system: No evidence of dissection, stenosis (50% or greater) or occlusion on either neutral or extension imaging. Vertebral arteries: Codominant. No evidence of dissection, stenosis (50% or greater) or occlusion. The left vertebral artery enters the left transverse foramen high at the C3 level. Skeleton: Mild degenerative change. Other neck: No evidence of acute abnormality. Reversal of the normal cervical lordosis. Upper chest: Visualized lung apices are clear. Review of the MIP images confirms the above findings CTA HEAD FINDINGS Anterior circulation: Bilateral intracranial ICAs, MCAs, and ACAs are patent without proximal hemodynamically significant stenosis. Limited distal vessel evaluation due to venous contamination. Posterior circulation: Bilateral intradural vertebral arteries, basilar artery, and  posterior cerebral arteries are patent without proximal hemodynamically significant stenosis. Venous sinuses: As permitted by contrast timing, patent. Review of the MIP images confirms the above findings IMPRESSION: CT head No evidence of acute intracranial abnormality. CTA head: No large vessel occlusion or proximal hemodynamically significant stenosis. Limited evaluation due to venous contamination. CTA neck: 1. No evidence of significant (greater than 50%) stenosis with neutral and extension positioning. 2. The left vertebral artery enters the left transverse foramen high at the C3 level. Electronically Signed   By: Feliberto Harts M.D.   On: 04/05/2021 20:14    Procedures Procedures   Medications Ordered in ED Medications  iohexol (OMNIPAQUE) 350 MG/ML injection 60 mL (60 mLs Intravenous Contrast Given 04/05/21 1919)  iohexol (OMNIPAQUE) 350 MG/ML injection 60 mL (60 mLs Intravenous Contrast Given 04/05/21 1904)    ED Course  I have reviewed the triage vital signs and the nursing notes.  Pertinent labs & imaging results that were available during my care  of the patient were reviewed by me and considered in my medical decision making (see chart for details).    MDM Rules/Calculators/A&P                           Rayven Rettig is a 25 y.o. female with no significant past medical history who presents with recurrent numerous syncopal episodes.  According to patient, for the last few months she has had weekly episodes of syncope.  She says that it has been escalating more recently and yesterday she had 4 episodes of full syncope.  She reports that it is usually when she is walking or exercising but not just when she stands up quickly.  She reports that it can also be provoked when she is stretching and extends her head all the way back.  She reports that when she does that she gets a numbness sensation in all extremities and then blacks out.  She reports she chronically has some upper neck/occipital discomfort for the last 6 months or so.  She also reports that she occasionally gets blurry vision preceding the syncope episodes and yesterday had right facial numbness along with numbness in the rest of her body before her syncopal episodes.  She denies any recent head trauma and does not have any other pains.  She denies any medication changes and denies any fevers, chills, cough, congestion, constipation, diarrhea, or urinary changes.  No seizure-like activity reported.  No reported history of Chiari malformation, intracranial abnormalities, tumors, bleeds, and she reports nothing runs in the family.  She did not report any history of MS or other neurologic disorders.  On exam, lungs were clear and chest was nontender.  Abdomen was nontender.  She did not have carotid bruit.  Normal sensation and strength in extremities.  Symmetric smile.  Normal finger-nose-finger testing bilaterally.  Pupils are symmetric and reactive normal extract movements.  Normal sensation of the face.  She did not have worsened neck pain with neck movement and she was nontender on my exam  of the neck.  No carotid bruit appreciated.  She did not have a syncopal episode when I had her tilt her head back at this point.  Clinically I am concerned that having several months of recurrent syncopal episodes escalating to 4 episodes yesterday that patient may need admission.  I called neurology in consultation to discuss imaging.  I am concerned that there could be a vertebrobasilar insufficiency, a vascular problem, tumor, or  Chiari cause of her syncopal episodes especially given the provocation with neck positioning.  Dr. Selina Cooley with neurology recommended CTA head and neck both in neutral position as well as with her tilting her head all the way back to try and provoke an insufficiency state.  She also felt that regardless of the imaging findings, she would likely need MRI with and without of her brain and admission for the recurrent syncopal episodes.  Of note, in triage patient had a urinalysis that did show UTI but she has no urinary symptoms.  We will send a culture and likely treat with antibiotics when work-up is completed.  Anticipate admission after work-up here is completed.  8:30 PM CT scans returned without showing evidence of acute stenosis or blockage in the neck arteries.  No other intracranial abnormality seen initially.  I spoke with neurology and Dr. Amada Jupiter who is suspicious for possible Chiari malformation.  He agrees with MRI of the head with and without contrast but he also request MRI cervical spine with and without contrast.  He feels he is appropriate for admission where she can then get the MRIs and if it shows any acute abnormality, neurosurgery would be consulted.  Medicine will be called for admission.   Final Clinical Impression(s) / ED Diagnoses Final diagnoses:  Syncope, unspecified syncope type      Clinical Impression: 1. Syncope, unspecified syncope type     Disposition: Admit  This note was prepared with assistance of Dragon voice  recognition software. Occasional wrong-word or sound-a-like substitutions may have occurred due to the inherent limitations of voice recognition software.     Ashunti Schofield, Canary Brim, MD 04/06/21 [phone]

## 2021-04-05 NOTE — ED Triage Notes (Signed)
States frequent lightheadedness, blurry vision, then "passes out"; onset 6 months ago; multiple episodes yesterday.

## 2021-04-05 NOTE — ED Notes (Signed)
After the CT scan in the extension position the pt reported seeing starts while in that position during the scan and she reported feeling dizzy after the scan.

## 2021-04-06 ENCOUNTER — Encounter (HOSPITAL_COMMUNITY): Payer: Self-pay | Admitting: Family Medicine

## 2021-04-06 ENCOUNTER — Observation Stay (HOSPITAL_COMMUNITY): Payer: Medicaid Other

## 2021-04-06 DIAGNOSIS — B9620 Unspecified Escherichia coli [E. coli] as the cause of diseases classified elsewhere: Secondary | ICD-10-CM | POA: Diagnosis not present

## 2021-04-06 DIAGNOSIS — R519 Headache, unspecified: Secondary | ICD-10-CM | POA: Diagnosis not present

## 2021-04-06 DIAGNOSIS — R404 Transient alteration of awareness: Secondary | ICD-10-CM | POA: Diagnosis not present

## 2021-04-06 DIAGNOSIS — F17200 Nicotine dependence, unspecified, uncomplicated: Secondary | ICD-10-CM | POA: Diagnosis not present

## 2021-04-06 DIAGNOSIS — R55 Syncope and collapse: Secondary | ICD-10-CM | POA: Diagnosis not present

## 2021-04-06 DIAGNOSIS — Z20822 Contact with and (suspected) exposure to covid-19: Secondary | ICD-10-CM | POA: Diagnosis not present

## 2021-04-06 DIAGNOSIS — Z79899 Other long term (current) drug therapy: Secondary | ICD-10-CM | POA: Diagnosis not present

## 2021-04-06 LAB — BASIC METABOLIC PANEL
Anion gap: 8 (ref 5–15)
BUN: 7 mg/dL (ref 6–20)
CO2: 26 mmol/L (ref 22–32)
Calcium: 9.3 mg/dL (ref 8.9–10.3)
Chloride: 106 mmol/L (ref 98–111)
Creatinine, Ser: 0.9 mg/dL (ref 0.44–1.00)
GFR, Estimated: >60 mL/min (ref >60)
Glucose, Bld: 122 mg/dL — ABNORMAL HIGH (ref 70–99)
Potassium: 3.5 mmol/L (ref 3.5–5.1)
Sodium: 140 mmol/L (ref 135–145)

## 2021-04-06 LAB — HIV ANTIBODY (ROUTINE TESTING W REFLEX): HIV Screen 4th Generation wRfx: NONREACTIVE

## 2021-04-06 IMAGING — MR MR CERVICAL SPINE WO/W CM
6 of 9 series · 27 of 48 positions shown · IV contrast (gadavist)
Comparison: CT angiogram head/neck [DATE].

CLINICAL DATA: Numbness or tingling, paresthesia. Syncope with neck
positioning, concern for Chiari versus cervical abnormality leading
to syncopal episodes.

EXAM:
MRI CERVICAL SPINE WITHOUT AND WITH CONTRAST
TECHNIQUE: Multiplanar and multiecho pulse sequences of the cervical spine, to
include the craniocervical junction and cervicothoracic junction,
were obtained without and with intravenous contrast.
CONTRAST:  6mL GADAVIST GADOBUTROL 1 MMOL/ML IV SOLN

[Series 5: T2 · sagittal · 3.0mm · 0.66mm/px · 4 of 15 slices shown (1 of 2)]
[im 1/15]
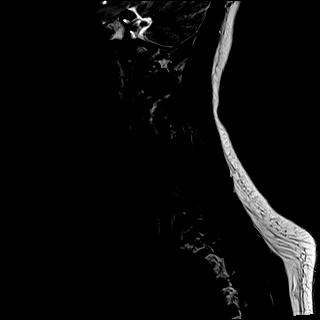
[im 5/15]
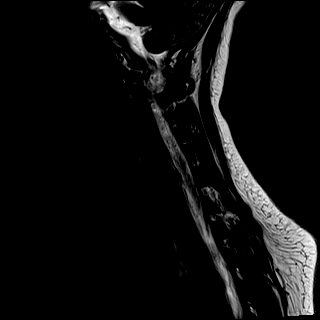
[im 10/15]
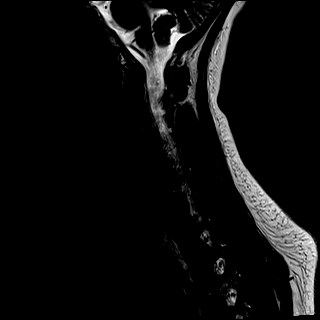
[im 15/15]
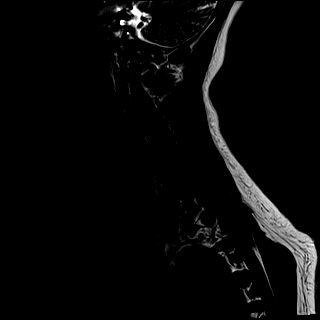

[Series 6: T1 · sagittal · 3.0mm · 0.69mm/px · 3 of 15 slices shown (1 of 2)]
[im 1/15]
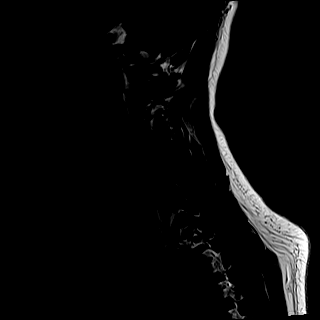
[im 8/15]
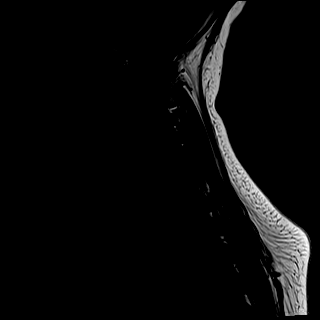
[im 15/15]
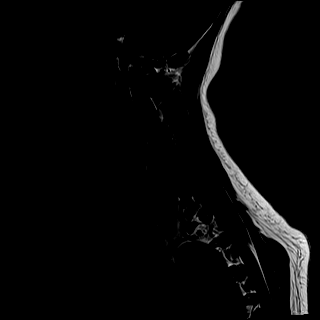

[Series 8: T2 · axial · 3.0mm · 0.66mm/px · z∈[-280,-160]mm · 8 of 40 slices shown (2 of 2)]
[im 1/40]
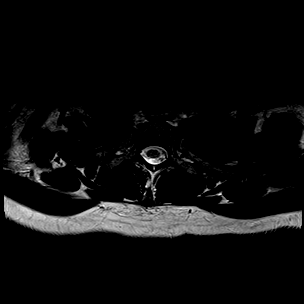
[im 6/40]
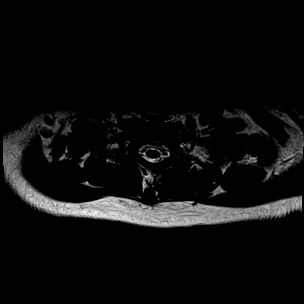
[im 12/40]
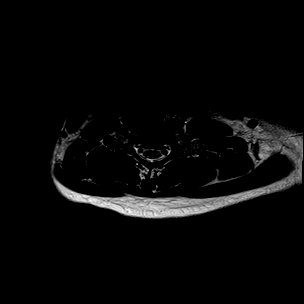
[im 17/40]
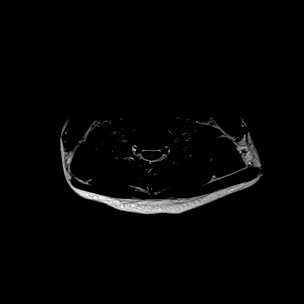
[im 23/40]
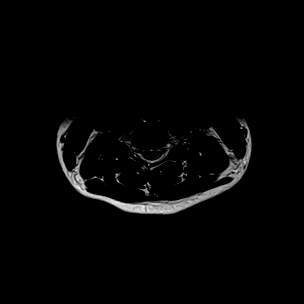
[im 28/40]
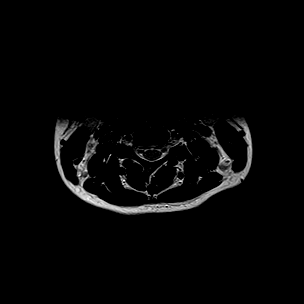
[im 34/40]
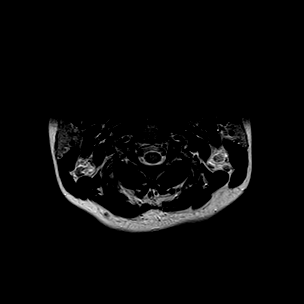
[im 40/40]
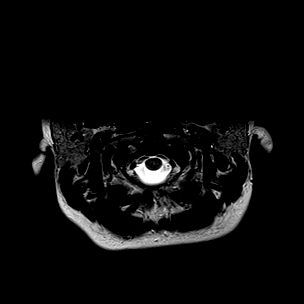

[Series 10: T1 · axial · 3.0mm · 0.39mm/px · z∈[-280,-160]mm · 8 of 40 slices shown (2 of 2)]
[im 1/40]
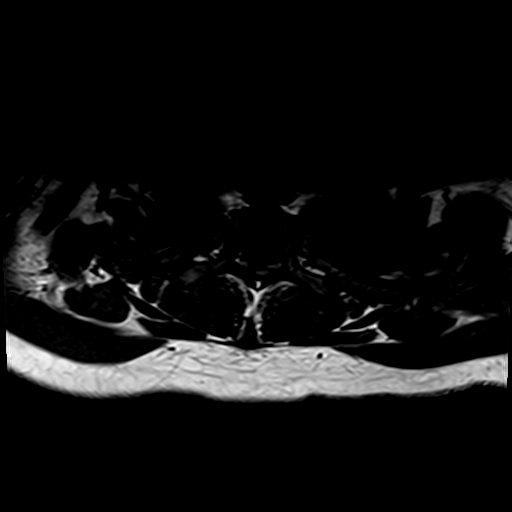
[im 6/40]
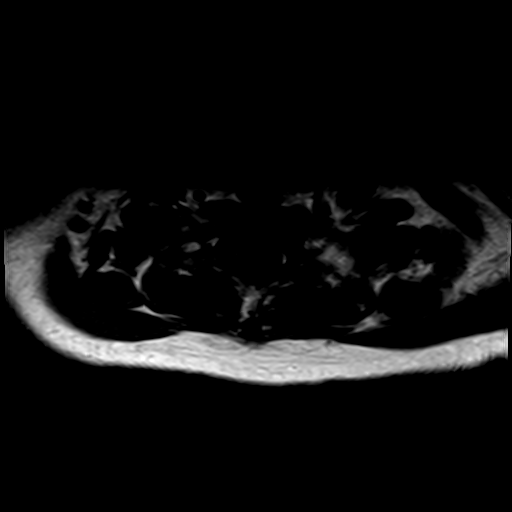
[im 12/40]
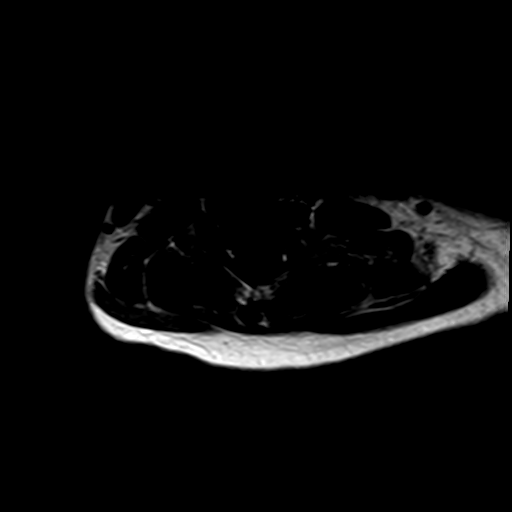
[im 17/40]
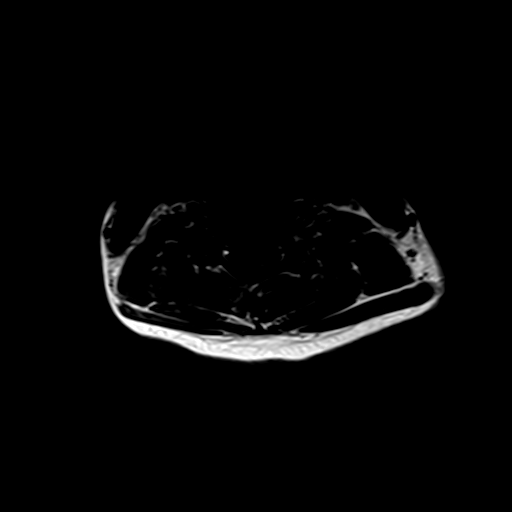
[im 23/40]
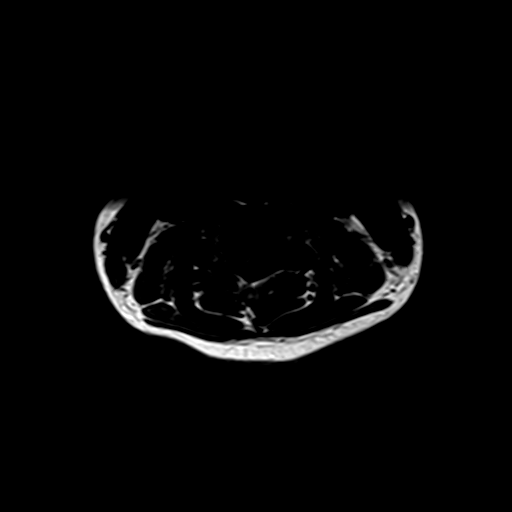
[im 28/40]
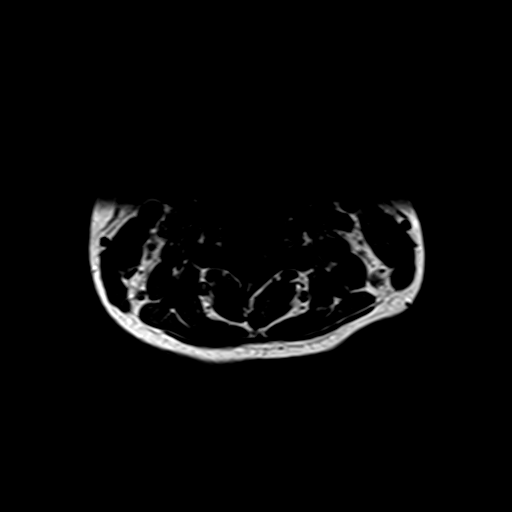
[im 34/40]
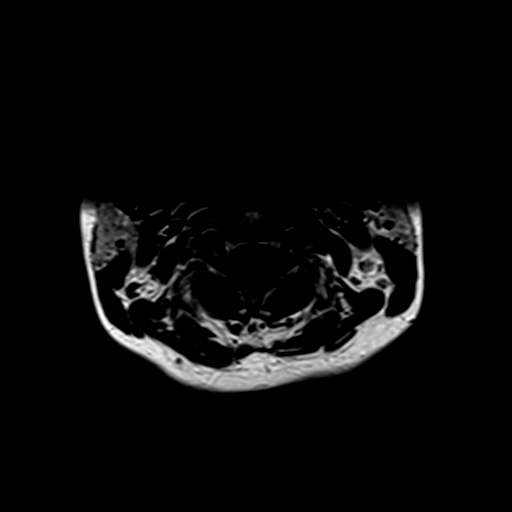
[im 40/40]
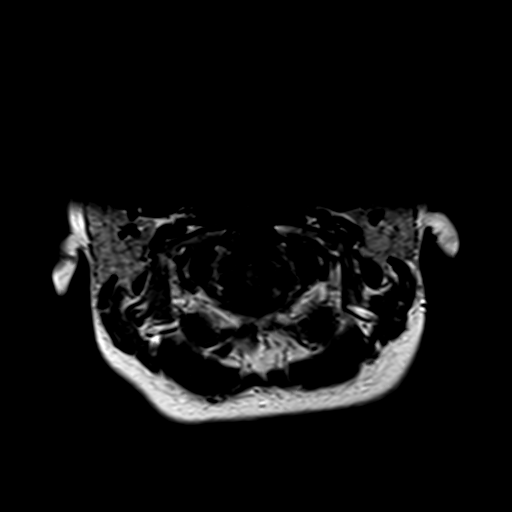

[Series 11: T1 fat-sat post-contrast · sagittal · 3.0mm · 0.43mm/px · 3 of 15 slices shown (1 of 2)]
[im 1/15]
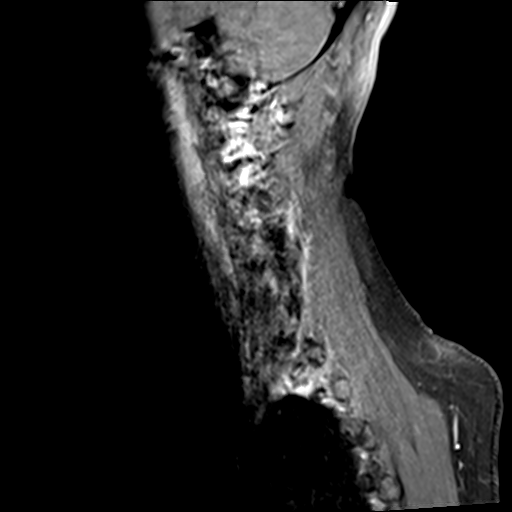
[im 8/15]
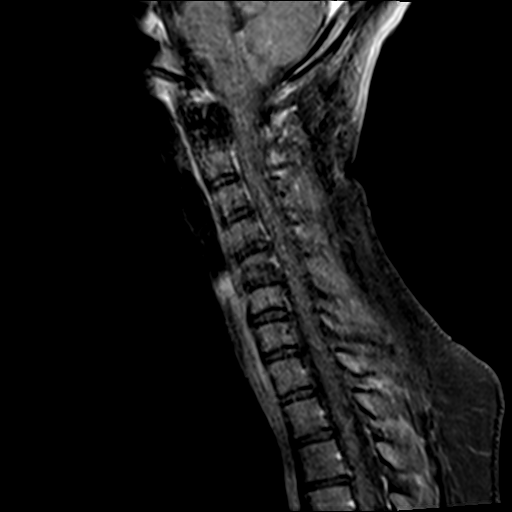
[im 15/15]
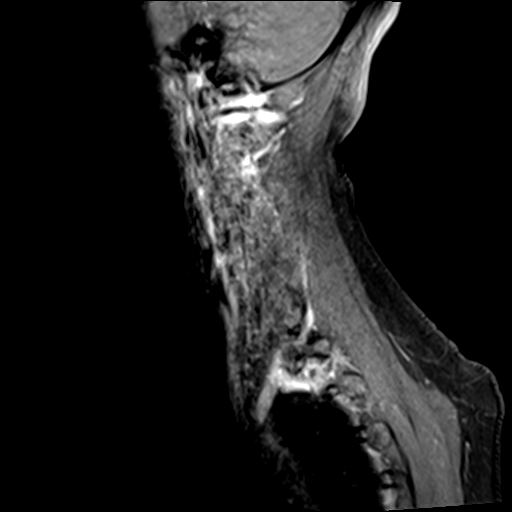

[Series 12: T1 fat-sat post-contrast · sagittal · 3.0mm · 0.43mm/px · 1 of 15 slices shown (2 of 2)]
[im 1/15]
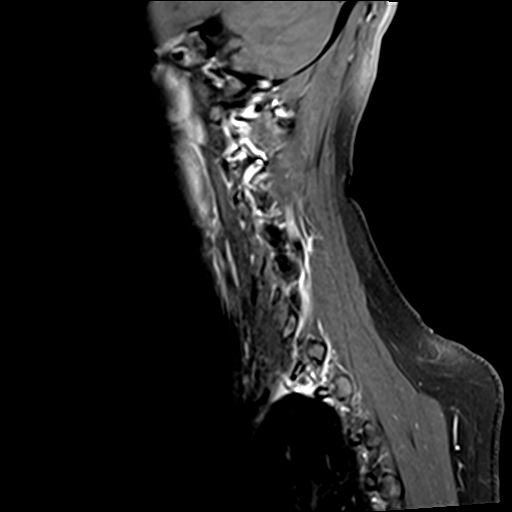

[27 of 48 positions shown; findings below may reference images not displayed]

FINDINGS: Mildly motion degraded exam.

Alignment: Straightening of the expected cervical lordosis. No
significant spondylolisthesis.

Vertebrae: Vertebral body height is maintained. Degenerative
endplate irregularity at C5-C6. Associated mild degenerative
endplate edema and enhancement at this level. Elsewhere, no
significant marrow edema or focal suspicious osseous lesion is
identified.

Cord: Within the limitations of motion degradation, no signal
abnormality is identified within the cervical spinal cord. No
abnormal spinal cord enhancement.

Posterior Fossa, vertebral arteries, paraspinal tissues: Posterior
fossa better assessed on concurrently performed brain MRI. Flow
voids preserved within the imaged cervical vertebral arteries.
Paraspinal soft tissues unremarkable.

Disc levels:

Mild multilevel disc degeneration, greatest at C5-C6.

C2-C3: No significant disc herniation or stenosis.

C3-C4: No significant disc herniation or stenosis.

C4-C5: Tiny left center disc protrusion. Uncovertebral hypertrophy
on the right. No significant spinal canal stenosis or neural
foraminal narrowing.

C5-C6: Disc bulge with bilateral uncovertebral hypertrophy. Mild
relative spinal canal narrowing (without spinal cord mass effect).
No significant foraminal stenosis.

C6-C7: Shallow broad-based left center disc protrusion. No
significant spinal canal or foraminal stenosis.

C7-T1: Shallow disc bulge. No significant spinal canal or foraminal
stenosis.
IMPRESSION: Mildly motion degraded exam.

No appreciable signal abnormality within the cervical spinal cord.
No abnormal spinal cord enhancement.

Cervical spondylosis, as outlined and with findings most notably as
follows.

At C5-C6, there is mild disc degeneration with degenerative endplate
irregularity and mild degenerative endplate edema/enhancement. Disc
bulge with bilateral uncovertebral hypertrophy. Mild relative spinal
canal narrowing (without spinal cord mass effect). No significant
foraminal stenosis.

No significant spinal canal or foraminal stenosis at the remaining
levels.

Straightening of the expected cervical lordosis.

## 2021-04-06 IMAGING — MR MR HEAD WO/W CM
14 of 16 series · 40 of 48 positions shown · IV contrast (gadavist)
Comparison: CT angiogram head/neck [DATE].

CLINICAL DATA: Syncope, recurrent. Numbness and tingling in
extremities.

EXAM:
MRI HEAD WITHOUT AND WITH CONTRAST
TECHNIQUE: Multiplanar, multiecho pulse sequences of the brain and surrounding
structures were obtained without and with intravenous contrast.
CONTRAST:  6mL GADAVIST GADOBUTROL 1 MMOL/ML IV SOLN

[Series 5: DWI · axial · 3.0mm · 0.88mm/px · z∈[-154,-11]mm · 5 of 100 slices shown (1 of 4)]
[im 1/100]
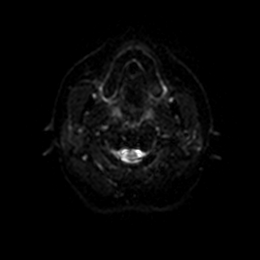
[im 25/100]
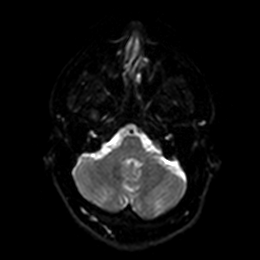
[im 50/100]
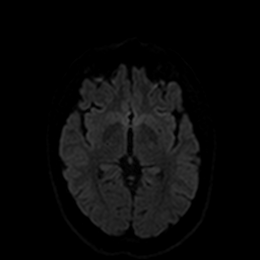
[im 75/100]
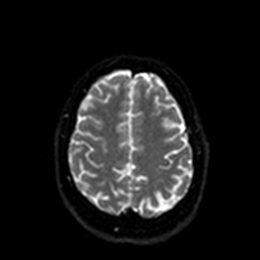
[im 100/100]
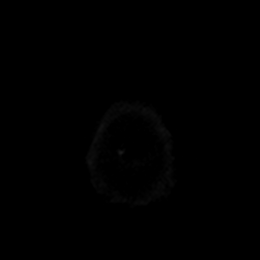

[Series 6: DWI · axial · 3.0mm · 0.88mm/px · z∈[-154,-11]mm · 2 of 50 slices shown (2 of 4)]
[im 1/50]
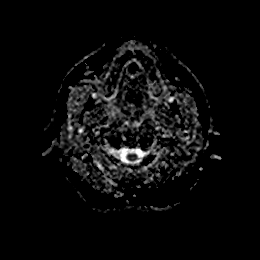
[im 50/50]
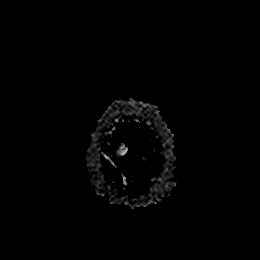

[Series 7: DWI · coronal · 4.0mm · 0.88mm/px · 4 of 68 slices shown (3 of 4)]
[im 1/68]
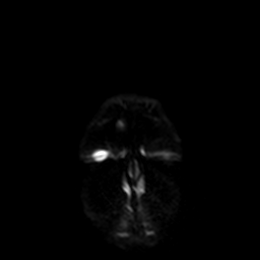
[im 23/68]
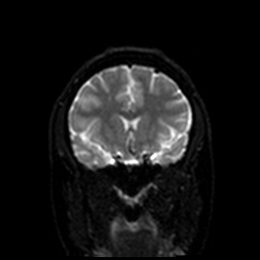
[im 45/68]
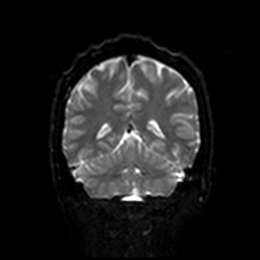
[im 68/68]
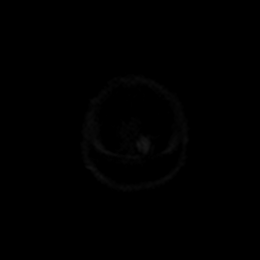

[Series 8: DWI · coronal · 4.0mm · 0.88mm/px · 2 of 34 slices shown (4 of 4)]
[im 1/34]
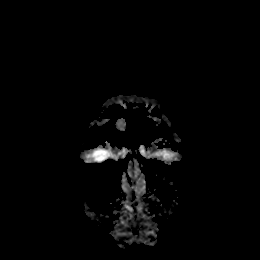
[im 34/34]
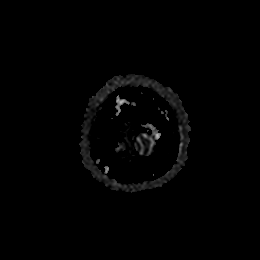

[Series 9: T1 · sagittal · 5.0mm · 0.75mm/px · 2 of 24 slices shown]
[im 1/24]
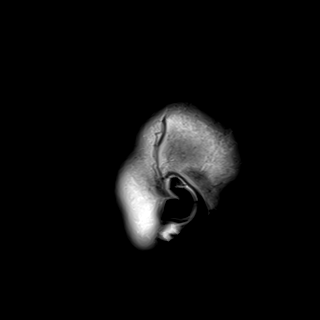
[im 24/24]
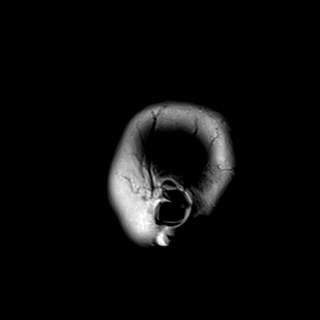

[Series 10: T2 · axial · 5.0mm · 0.72mm/px · z∈[-151,-10]mm · 2 of 25 slices shown]
[im 1/25]
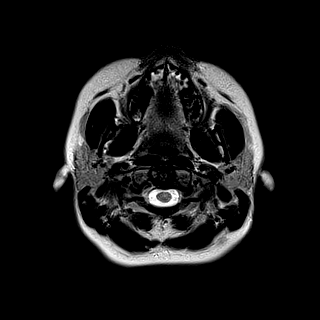
[im 25/25]
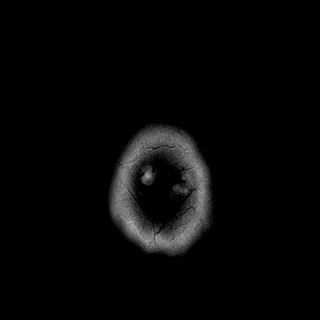

[Series 11: FLAIR · axial · 5.0mm · 0.45mm/px · z∈[-152,-11]mm · 2 of 25 slices shown]
[im 1/25]
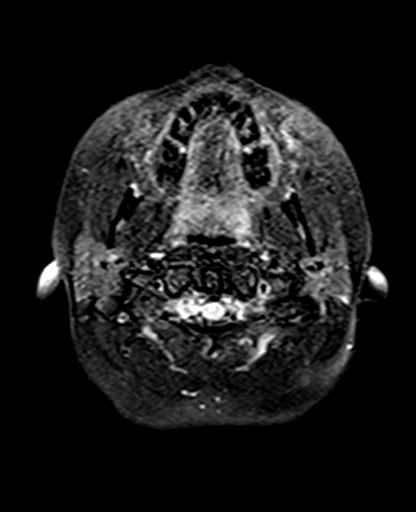
[im 25/25]
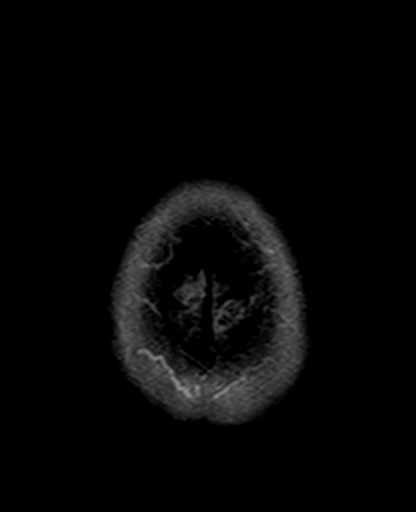

[Series 12: mag_images · axial · 3.0mm · 0.90mm/px · z∈[-169,+4]mm · 4 of 60 slices shown]
[im 1/60]
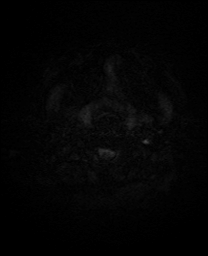
[im 20/60]
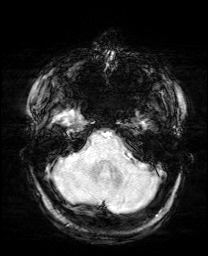
[im 40/60]
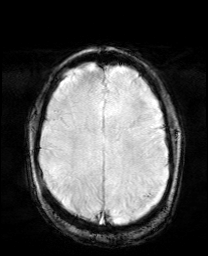
[im 60/60]
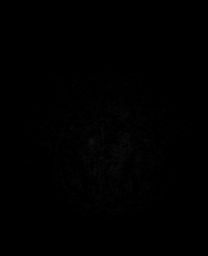

[Series 13: pha_images · axial · 3.0mm · 0.90mm/px · z∈[-169,-2]mm · 4 of 58 slices shown]
[im 1/58]
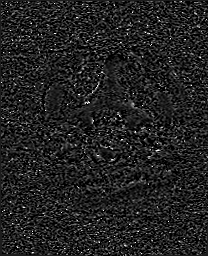
[im 20/58]
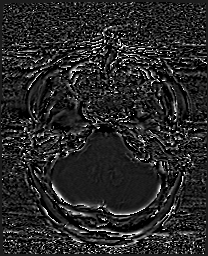
[im 39/58]
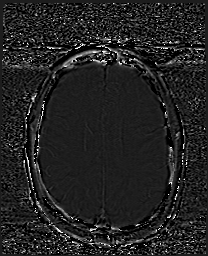
[im 58/58]
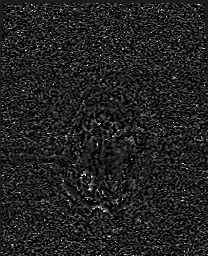

[Series 14: swi_images · axial · 3.0mm · 0.90mm/px · z∈[-169,+4]mm · 4 of 60 slices shown]
[im 1/60]
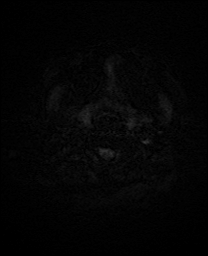
[im 20/60]
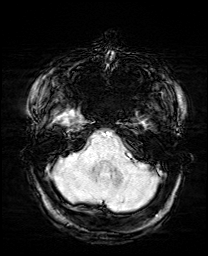
[im 40/60]
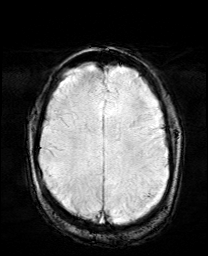
[im 60/60]
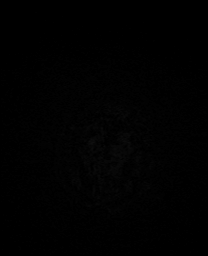

[Series 15: mip_images(sw) · axial · 24.0mm · 0.90mm/px · z∈[-159,-7]mm · 3 of 53 slices shown]
[im 1/53]
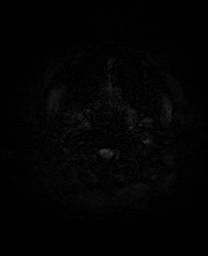
[im 27/53]
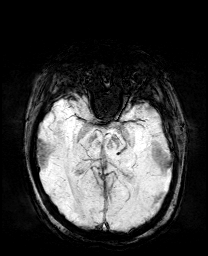
[im 53/53]
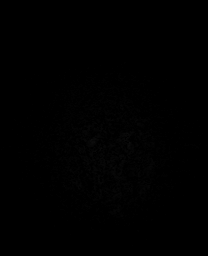

[Series 17: T2 post-contrast · coronal · 5.0mm · 0.72mm/px · 2 of 28 slices shown]
[im 1/28]
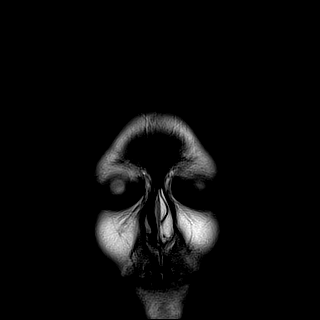
[im 28/28]
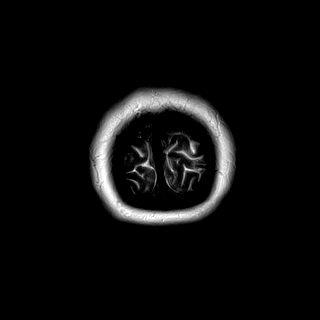

[Series 19: T1 post-contrast · coronal · 5.0mm · 0.34mm/px · 2 of 32 slices shown (1 of 2)]
[im 1/32]
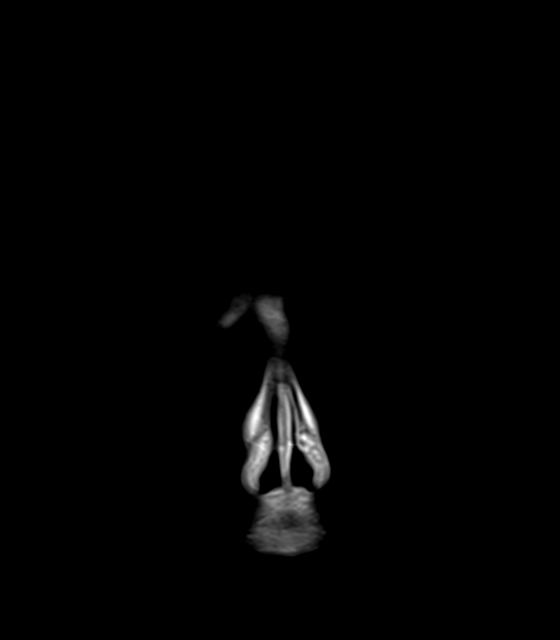
[im 32/32]
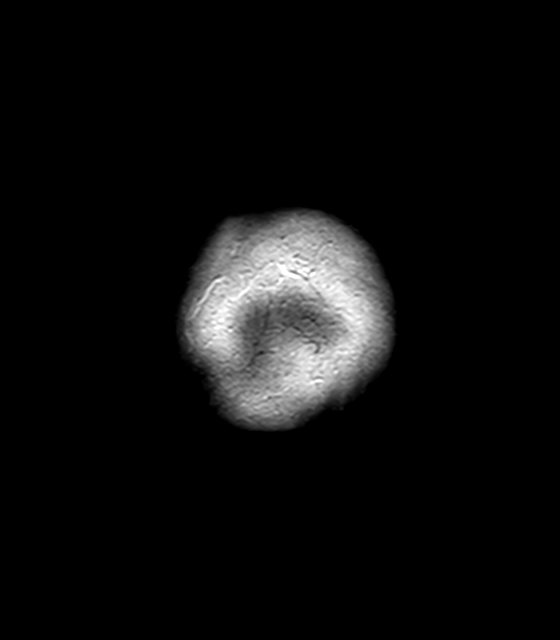

[Series 20: T1 post-contrast · sagittal · 5.0mm · 0.72mm/px · 2 of 24 slices shown (2 of 2)]
[im 1/24]
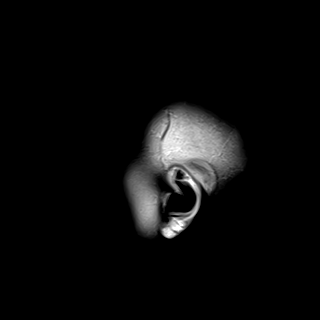
[im 24/24]
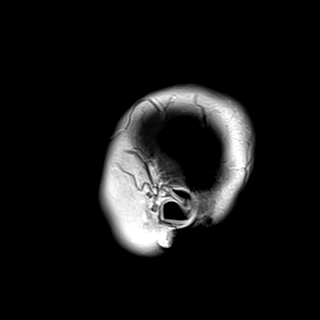

[40 of 48 positions shown; findings below may reference images not displayed]

FINDINGS: Brain:

Mild intermittent motion degradation.

No cortical encephalomalacia is identified. No significant cerebral
white matter disease.

There is no acute infarct.

No evidence of an intracranial mass.

No chronic intracranial blood products.

No extra-axial fluid collection.

No midline shift.

No pathologic intracranial enhancement identified.

Vascular: Maintained flow voids within the proximal large arterial
vessels.

Skull and upper cervical spine: No focal suspicious marrow lesion.

Sinuses/Orbits: Visualized orbits show no acute finding. Minimal
mucosal thickening within the anterior ethmoid sinuses bilaterally.

Other: Trace fluid within the bilateral mastoid air cells.
IMPRESSION: Mildly motion degraded exam.

Unremarkable MRI appearance of the brain. No evidence of acute
intracranial abnormality.

Minimal bilateral ethmoid sinus mucosal thickening.

Trace fluid within the bilateral mastoid air cells.

## 2021-04-06 MED ORDER — GADOBUTROL 1 MMOL/ML IV SOLN
6.0000 mL | Freq: Once | INTRAVENOUS | Status: AC | PRN
  Administered 2021-04-06: 6 mL via INTRAVENOUS

## 2021-04-06 MED ORDER — SENNOSIDES-DOCUSATE SODIUM 8.6-50 MG PO TABS: 1.0000 | ORAL_TABLET | Freq: Every evening | ORAL | Status: DC | PRN

## 2021-04-06 MED ORDER — SODIUM CHLORIDE 0.9% FLUSH: 3.0000 mL | INTRAVENOUS | Status: DC | PRN

## 2021-04-06 MED ORDER — SODIUM CHLORIDE 0.9 % IV SOLN: 250.0000 mL | INTRAVENOUS | Status: DC | PRN

## 2021-04-06 MED ORDER — ONDANSETRON HCL 4 MG/2ML IJ SOLN: 4.0000 mg | Freq: Four times a day (QID) | INTRAMUSCULAR | Status: DC | PRN

## 2021-04-06 MED ORDER — TRAZODONE HCL 50 MG PO TABS: 50.0000 mg | ORAL_TABLET | Freq: Every evening | ORAL | Status: DC | PRN

## 2021-04-06 MED ORDER — ONDANSETRON HCL 4 MG PO TABS: 4.0000 mg | ORAL_TABLET | Freq: Four times a day (QID) | ORAL | Status: DC | PRN

## 2021-04-06 MED ORDER — SODIUM CHLORIDE 0.9% FLUSH
3.0000 mL | Freq: Two times a day (BID) | INTRAVENOUS | Status: DC
  Administered 2021-04-06 – 2021-04-07 (×3): 3 mL via INTRAVENOUS

## 2021-04-06 MED ORDER — IBUPROFEN 200 MG PO TABS: 400.0000 mg | ORAL_TABLET | Freq: Four times a day (QID) | ORAL | Status: DC | PRN

## 2021-04-06 NOTE — Progress Notes (Signed)
Patient unavailable for EEG.  She is going to MRI.  RN will call when she is available.

## 2021-04-06 NOTE — H&P (Signed)
History and Physical    Jenna Glover NIO:[id number] DOB: 1995-08-10 DOA: 04/05/2021  PCP: Pcp, No   Patient coming from: Home  Chief Complaint:   Repeated episodes of passing out  HPI: Jenna Glover is a 25 y.o. female with no significant past medical history who presents for evaluation of syncope.  She reports she has had numerous syncopal episodes over the last 6 months.  As of lately she has had more frequent syncopal episodes.  She reports yesterday she had 4 episodes of syncope.  She states that it occurs when she first stands up and if she extends her head neck back.  She states that when she does extend her neck and head back she has a numb sensation in her extremities and blacks out.  She has not had any palpitations or chest pain with the syncopal episodes.  She reports she is only out for few seconds and comes to.  She states that when she first comes to she knows that she passed out but still feels little foggy but has not had any seizure-like activity and has not had any bowel or bladder incontinence.  She states that she has had intermittent times of blurry vision that proceeds just before the syncopal episode.  States that yesterday she had some numbness in the right side of her face before one of the episodes but when she woke up she had no numbness.  He states over the last few months she occasionally gets weakness in her hands and has dropped things.  She has not had any injury or trauma to her head or neck.  She does not know of any family history of similar problems in other family members.  He denies any history of multiple sclerosis or neurological disorders.  She is married and lives with her husband and 3-year-old son.  She states she occasionally smokes cigarettes and smokes a small amount of marijuana daily.  She denies other illicit drugs or alcohol use.  ED Course: Been hemodynamically stable in the emergency room with no episodes of syncope or complaints of chest pain or  palpitations.  She had work-up with CT of her head and CTA of her head and neck that were negative for acute abnormality.  Chest x-ray was negative.  CBC was unremarkable.  CMP was unremarkable.  Case was discussed with neurology who recommended MRI of the brain and cervical spine to evaluate for Chiari malformation.  Hospitalist service was asked to evaluate and monitor patient overnight for further work-up  Review of Systems:  General: Denies fever, chills, weight loss, night sweats.  Denies dizziness.  Denies change in appetite HENT: Denies head trauma, headache, denies change in hearing, tinnitus.  Denies nasal bleeding.  Denies sore throat.  Denies difficulty swallowing Eyes: Denies blurry vision, pain in eye, drainage.  Denies discoloration of eyes. Neck: Denies pain.  Denies swelling.  Denies pain with movement. Cardiovascular: Denies chest pain, palpitations.  Denies edema.  Denies orthopnea Respiratory: Denies shortness of breath, cough.  Denies wheezing.  Denies sputum production Gastrointestinal: Denies abdominal pain, swelling.  Denies nausea, vomiting, diarrhea.  Denies melena.  Denies hematemesis. Musculoskeletal: Denies limitation of movement.  Denies deformity or swelling. Denies arthralgias or myalgias. Genitourinary: Denies pelvic pain.  Denies urinary frequency or hesitancy.  Denies dysuria.  Skin: Denies rash.  Denies petechiae, purpura, ecchymosis. Neurological: Denies seizure activity. Denies  paresthesia. Denies slurred speech, drooping face. Denies visual change. Psychiatric: Denies depression, anxiety. Denies hallucinations.  Past Medical History:  Diagnosis Date   History of concussion     History reviewed. No pertinent surgical history.  Social History  reports that she has been smoking. She has been exposed to tobacco smoke. She has never used smokeless tobacco. She reports current alcohol use. She reports current drug use. Drug: Marijuana.  Allergies  Allergen  Reactions   Tylenol [Acetaminophen] Nausea And Vomiting    History reviewed. No pertinent family history.   Prior to Admission medications   Medication Sig Start Date End Date Taking? Authorizing Provider  metroNIDAZOLE (FLAGYL) 500 MG tablet Take 1 tablet (500 mg total) by mouth 2 (two) times daily. 08/11/20   Robinson, [place] N, PA-C  promethazine (PHENERGAN) 12.5 MG tablet Take 1 tablet (12.5 mg total) by mouth every 6 (six) hours as needed for nausea or vomiting. 06/21/16   Lattie Haw, MD    Physical Exam: Vitals:   04/05/21 2330 04/06/21 0000 04/06/21 0015 04/06/21 0251  BP: 90/71 113/78 (!) 112/101 (!) 141/80  Pulse: 69 71 64 79  Resp: (!) 21 18 15 18   Temp:  98.4 F (36.9 C)  98.5 F (36.9 C)  TempSrc:    Oral  SpO2: 100% 99% 99% 99%  Weight:      Height:        Constitutional: NAD, calm, comfortable Vitals:   04/05/21 2330 04/06/21 0000 04/06/21 0015 04/06/21 0251  BP: 90/71 113/78 (!) 112/101 (!) 141/80  Pulse: 69 71 64 79  Resp: (!) 21 18 15 18   Temp:  98.4 F (36.9 C)  98.5 F (36.9 C)  TempSrc:    Oral  SpO2: 100% 99% 99% 99%  Weight:      Height:       General: WDWN, Alert and oriented x3.  Eyes: EOMI, PERRL, conjunctivae normal. Sclera nonicteric HENT:  [place]/AT, external ears normal. Nares patent without epistasis. Mucous membranes are moist. Posterior pharynx clear Neck: Soft, normal range of motion, supple, no masses, no thyromegaly. Trachea midline Respiratory: clear to auscultation bilaterally, no wheezing, no crackles. Normal respiratory effort. No accessory muscle use.  Cardiovascular: Regular rate and rhythm, no murmurs / rubs / gallops. No extremity edema. 2+ pedal pulses.   Abdomen: Soft, no tenderness, nondistended, no rebound or guarding.  No masses palpated. Bowel sounds normoactive Musculoskeletal: FROM. no cyanosis. No joint deformity upper and lower extremities. Normal muscle tone.  Skin: Warm, dry, intact no rashes, lesions, ulcers. No  induration Neurologic: CN 2-12 grossly intact. Normal speech. Sensation intact to touch, patella DTR +1 bilaterally. Strength 5/5 in all extremities. No tremor.  Babinski downgoing bilaterally Psychiatric: Normal judgment and insight.  Normal mood.    Labs on Admission: I have personally reviewed following labs and imaging studies  CBC: Recent Labs  Lab 04/05/21 1145  WBC 8.8  HGB 14.0  HCT 41.5  MCV 86.8  PLT 244    Basic Metabolic Panel: Recent Labs  Lab 04/05/21 1145  NA 136  K 4.1  CL 106  CO2 21*  GLUCOSE 87  BUN 10  CREATININE 0.64  CALCIUM 9.1  MG 2.1    GFR: Estimated Creatinine Clearance: 88.9 mL/min (by C-G formula based on SCr of 0.64 mg/dL).  Liver Function Tests: Recent Labs  Lab 04/05/21 1145  AST 13*  ALT 9  ALKPHOS 35*  BILITOT 0.4  PROT 7.3  ALBUMIN 4.6    Urine analysis:    Component Value Date/Time   COLORURINE YELLOW 04/05/2021 1500   APPEARANCEUR HAZY (A) 04/05/2021 1500  LABSPEC 1.006 04/05/2021 1500   PHURINE 5.5 04/05/2021 1500   GLUCOSEU NEGATIVE 04/05/2021 1500   HGBUR NEGATIVE 04/05/2021 1500   BILIRUBINUR NEGATIVE 04/05/2021 1500   BILIRUBINUR negative 06/21/2016 1707   KETONESUR NEGATIVE 04/05/2021 1500   PROTEINUR NEGATIVE 04/05/2021 1500   UROBILINOGEN 0.2 06/21/2016 1707   NITRITE POSITIVE (A) 04/05/2021 1500   LEUKOCYTESUR LARGE (A) 04/05/2021 1500    Radiological Exams on Admission: CT Angio Head W or Wo Contrast  Result Date: 04/05/2021 CLINICAL DATA:  Syncope, recurrent Per neurology, CTA head and neck both with normal positioning and with maximal neck extension as this has provoked patient's syncope. EXAM: CT ANGIOGRAPHY HEAD AND NECK TECHNIQUE: Multidetector CT imaging of the head and neck was performed using the standard protocol during bolus administration of intravenous contrast. Multiplanar CT image reconstructions and MIPs were obtained to evaluate the vascular anatomy. Carotid stenosis measurements  (when applicable) are obtained utilizing NASCET criteria, using the distal internal carotid diameter as the denominator. CTA of the neck was performed in neutral position and extension due to the patient's worsening syncope with extension. CONTRAST:  58mL OMNIPAQUE IOHEXOL 350 MG/ML SOLN, 35mL OMNIPAQUE IOHEXOL 350 MG/ML SOLN COMPARISON:  None. FINDINGS: CT HEAD FINDINGS Brain: No evidence of acute large vascular territory infarct. Infarction, hemorrhage, hydrocephalus, extra-axial collection or mass lesion/mass effect. Vascular: See below. Skull: No acute fracture. Sinuses: Visualized sinuses are clear. Orbits: No acute findings in the visualized orbits. Review of the MIP images confirms the above findings CTA NECK FINDINGS Aortic arch: Great vessel origins are patent. Right carotid system: No evidence of dissection, stenosis (50% or greater) or occlusion on either neutral or extension imaging. The left. Left carotid system: No evidence of dissection, stenosis (50% or greater) or occlusion on either neutral or extension imaging. Vertebral arteries: Codominant. No evidence of dissection, stenosis (50% or greater) or occlusion. The left vertebral artery enters the left transverse foramen high at the C3 level. Skeleton: Mild degenerative change. Other neck: No evidence of acute abnormality. Reversal of the normal cervical lordosis. Upper chest: Visualized lung apices are clear. Review of the MIP images confirms the above findings CTA HEAD FINDINGS Anterior circulation: Bilateral intracranial ICAs, MCAs, and ACAs are patent without proximal hemodynamically significant stenosis. Limited distal vessel evaluation due to venous contamination. Posterior circulation: Bilateral intradural vertebral arteries, basilar artery, and posterior cerebral arteries are patent without proximal hemodynamically significant stenosis. Venous sinuses: As permitted by contrast timing, patent. Review of the MIP images confirms the above  findings IMPRESSION: CT head No evidence of acute intracranial abnormality. CTA head: No large vessel occlusion or proximal hemodynamically significant stenosis. Limited evaluation due to venous contamination. CTA neck: 1. No evidence of significant (greater than 50%) stenosis with neutral and extension positioning. 2. The left vertebral artery enters the left transverse foramen high at the C3 level. Electronically Signed   By: Feliberto Harts M.D.   On: 04/05/2021 20:14   DG Chest 2 View  Result Date: 04/05/2021 CLINICAL DATA:  Recurrent syncope EXAM: CHEST - 2 VIEW COMPARISON:  None. FINDINGS: The heart size and mediastinal contours are within normal limits. Both lungs are clear. The visualized skeletal structures are unremarkable. IMPRESSION: Normal study. Electronically Signed   By: Charlett Nose M.D.   On: 04/05/2021 18:47   CT ANGIO NECK W OR WO CONTRAST  Result Date: 04/05/2021 CLINICAL DATA:  Syncope, recurrent Per neurology, CTA head and neck both with normal positioning and with maximal neck extension as this has provoked patient's  syncope. EXAM: CT ANGIOGRAPHY HEAD AND NECK TECHNIQUE: Multidetector CT imaging of the head and neck was performed using the standard protocol during bolus administration of intravenous contrast. Multiplanar CT image reconstructions and MIPs were obtained to evaluate the vascular anatomy. Carotid stenosis measurements (when applicable) are obtained utilizing NASCET criteria, using the distal internal carotid diameter as the denominator. CTA of the neck was performed in neutral position and extension due to the patient's worsening syncope with extension. CONTRAST:  26mL OMNIPAQUE IOHEXOL 350 MG/ML SOLN, 69mL OMNIPAQUE IOHEXOL 350 MG/ML SOLN COMPARISON:  None. FINDINGS: CT HEAD FINDINGS Brain: No evidence of acute large vascular territory infarct. Infarction, hemorrhage, hydrocephalus, extra-axial collection or mass lesion/mass effect. Vascular: See below. Skull: No  acute fracture. Sinuses: Visualized sinuses are clear. Orbits: No acute findings in the visualized orbits. Review of the MIP images confirms the above findings CTA NECK FINDINGS Aortic arch: Great vessel origins are patent. Right carotid system: No evidence of dissection, stenosis (50% or greater) or occlusion on either neutral or extension imaging. The left. Left carotid system: No evidence of dissection, stenosis (50% or greater) or occlusion on either neutral or extension imaging. Vertebral arteries: Codominant. No evidence of dissection, stenosis (50% or greater) or occlusion. The left vertebral artery enters the left transverse foramen high at the C3 level. Skeleton: Mild degenerative change. Other neck: No evidence of acute abnormality. Reversal of the normal cervical lordosis. Upper chest: Visualized lung apices are clear. Review of the MIP images confirms the above findings CTA HEAD FINDINGS Anterior circulation: Bilateral intracranial ICAs, MCAs, and ACAs are patent without proximal hemodynamically significant stenosis. Limited distal vessel evaluation due to venous contamination. Posterior circulation: Bilateral intradural vertebral arteries, basilar artery, and posterior cerebral arteries are patent without proximal hemodynamically significant stenosis. Venous sinuses: As permitted by contrast timing, patent. Review of the MIP images confirms the above findings IMPRESSION: CT head No evidence of acute intracranial abnormality. CTA head: No large vessel occlusion or proximal hemodynamically significant stenosis. Limited evaluation due to venous contamination. CTA neck: 1. No evidence of significant (greater than 50%) stenosis with neutral and extension positioning. 2. The left vertebral artery enters the left transverse foramen high at the C3 level. Electronically Signed   By: Feliberto Harts M.D.   On: 04/05/2021 20:14   CT Angio Neck W and/or Wo Contrast  Result Date: 04/05/2021 CLINICAL DATA:   Syncope, recurrent Per neurology, CTA head and neck both with normal positioning and with maximal neck extension as this has provoked patient's syncope. EXAM: CT ANGIOGRAPHY HEAD AND NECK TECHNIQUE: Multidetector CT imaging of the head and neck was performed using the standard protocol during bolus administration of intravenous contrast. Multiplanar CT image reconstructions and MIPs were obtained to evaluate the vascular anatomy. Carotid stenosis measurements (when applicable) are obtained utilizing NASCET criteria, using the distal internal carotid diameter as the denominator. CTA of the neck was performed in neutral position and extension due to the patient's worsening syncope with extension. CONTRAST:  85mL OMNIPAQUE IOHEXOL 350 MG/ML SOLN, 58mL OMNIPAQUE IOHEXOL 350 MG/ML SOLN COMPARISON:  None. FINDINGS: CT HEAD FINDINGS Brain: No evidence of acute large vascular territory infarct. Infarction, hemorrhage, hydrocephalus, extra-axial collection or mass lesion/mass effect. Vascular: See below. Skull: No acute fracture. Sinuses: Visualized sinuses are clear. Orbits: No acute findings in the visualized orbits. Review of the MIP images confirms the above findings CTA NECK FINDINGS Aortic arch: Great vessel origins are patent. Right carotid system: No evidence of dissection, stenosis (50% or greater) or occlusion  on either neutral or extension imaging. The left. Left carotid system: No evidence of dissection, stenosis (50% or greater) or occlusion on either neutral or extension imaging. Vertebral arteries: Codominant. No evidence of dissection, stenosis (50% or greater) or occlusion. The left vertebral artery enters the left transverse foramen high at the C3 level. Skeleton: Mild degenerative change. Other neck: No evidence of acute abnormality. Reversal of the normal cervical lordosis. Upper chest: Visualized lung apices are clear. Review of the MIP images confirms the above findings CTA HEAD FINDINGS Anterior  circulation: Bilateral intracranial ICAs, MCAs, and ACAs are patent without proximal hemodynamically significant stenosis. Limited distal vessel evaluation due to venous contamination. Posterior circulation: Bilateral intradural vertebral arteries, basilar artery, and posterior cerebral arteries are patent without proximal hemodynamically significant stenosis. Venous sinuses: As permitted by contrast timing, patent. Review of the MIP images confirms the above findings IMPRESSION: CT head No evidence of acute intracranial abnormality. CTA head: No large vessel occlusion or proximal hemodynamically significant stenosis. Limited evaluation due to venous contamination. CTA neck: 1. No evidence of significant (greater than 50%) stenosis with neutral and extension positioning. 2. The left vertebral artery enters the left transverse foramen high at the C3 level. Electronically Signed   By: Feliberto Harts M.D.   On: 04/05/2021 20:14     Assessment/Plan Principal Problem:   Syncope Jenna Glover is placed on cardiac telemetry for observation.  Obtain EKG as no reported EKG being done in the emergency room.  Monitor on telemetry for arrhythmia the patient has no symptoms of palpitations and has no history of cardiac disorders Patient had CT of the head that was negative for intracranial pathology.  CTA of the head and neck showed no large vessel occlusion or significant abnormality. Case was discussed with neurology who recommended MRI of the brain and cervical spine to further evaluate for Chiari malformation    DVT prophylaxis: Padua score low, early ambulation for DVT prophylaxis. Up with assistance with recurrent syncope  Code Status:   Full Code  Family Communication:  Diagnosis and plan discussed with patient and her husband who is at bedside.  They verbalized understanding and agree with plan.  Further recommendations to follow as clinical indicated Disposition Plan:   Patient is  from:  Home  Anticipated DC to:  Home  Anticipated DC date:  Anticipate less than 2 midnight stay in the hospital  Consults called:  Neurology consulted by ER physician  Admission status:  Observation  Claudean Severance Clayvon Parlett MD [facility]  How to contact the [facility] Attending or Consulting provider 7A - 7P or covering provider during after hours 7P -7A, for this patient?   Check the care team in [facility] and look for a) attending/consulting [facility] provider listed and b) the [facility] team listed Log into [email or web address] and use [facility] universal password to access. If you do not have the password, please contact the hospital operator. Locate the [facility] provider you are looking for under [facility] and page to a number that you can be directly reached. If you still have difficulty reaching the provider, please page the [facility] (Director on Call) for the Hospitalists listed on amion for assistance.  04/06/2021, 3:15 AM

## 2021-04-06 NOTE — Plan of Care (Signed)

## 2021-04-06 NOTE — Progress Notes (Signed)
EEG complete - results pending 

## 2021-04-06 NOTE — Consult Note (Addendum)
Neurology Consultation Reason for Consult: Repeated episodes of passing out Referring Physician: Dr Zannie Cove  CC: Repeated episodes of passing out  History is obtained from:patient and husband at bedside  HPI: Jenna Glover is a 25 y.o. female with no significant past medical history who presented to ED after multiple episodes of syncope with loss of awareness. Patient states these episodes started about 6 months ago. She was walking over to pick up her 55 year old daughter and suddenly fell down without loss of consciousness, was able to "catch her fall" and didnt hit her head. She didn't think much of it but gradually the episodes started happening repeatedly and she had 4 episode yesterday. Reports feeling dizzy and blurry vision in both eyes prior to the episode followed by falling down, sometimes she has loss of awareness for 3-4 minutes. Also reports some twitching of extremities with these episodes, after the fall. Denies B/B incontinence, chest pain, SOB, palpitations. All the episodes happen when patient is standing up or walking, can be triggered by exercising or extending her neck and abducting her arms behind the neck.    Patient also states she has bifrontal headache which at times is predominantly left sided headache, associated with nausea, photophobia, associated with numbness of right side of face, on and off for last 4 months. Patient takes aleve and tylenol which helps some almost everyday. States she got IM depo shot in [place], no other new medications in recent past.    Denies h/o epilepsy in family, migraine, heart disease.    ROS: All other systems reviewed and negative except as noted in the HPI.   Past Medical History:  Diagnosis Date   History of concussion     History reviewed. No pertinent family history.   Social History:  reports that she has been smoking. She has been exposed to tobacco smoke. She has never used smokeless tobacco. She reports current  alcohol use. She reports current drug use. Drug: Marijuana.   Medications Prior to Admission  Medication Sig Dispense Refill Last Dose   metroNIDAZOLE (FLAGYL) 500 MG tablet Take 1 tablet (500 mg total) by mouth 2 (two) times daily. 14 tablet 0    promethazine (PHENERGAN) 12.5 MG tablet Take 1 tablet (12.5 mg total) by mouth every 6 (six) hours as needed for nausea or vomiting. 20 tablet 0      Exam: Current vital signs: BP 110/61 (BP Location: Left Arm)   Pulse 85   Temp 98 F (36.7 C) (Oral)   Resp 20   Ht 5\' 3"  (1.6 m)   Wt 59 kg   LMP 03/31/2021 (Approximate)   SpO2 96%   Breastfeeding No   BMI 23.03 kg/m  Vital signs in last 24 hours: Temp:  [98 F (36.7 C)-98.5 F (36.9 C)] 98 F (36.7 C) (10/26 1148) Pulse Rate:  [64-85] 85 (10/26 1148) Resp:  [15-25] 20 (10/26 1148) BP: (90-141)/(61-121) 110/61 (10/26 1148) SpO2:  [96 %-100 %] 96 % (10/26 1148)   Physical Exam  Constitutional: Appears well-developed and well-nourished.  Psych: Affect appropriate to situation Eyes: No scleral injection HENT: No OP obstrucion Head: Normocephalic.  Cardiovascular: Normal rate and regular rhythm.  Respiratory: Effort normal, non-labored breathing GI: Soft.  No distension. There is no tenderness.  Skin: Warm Neuro: Aox3, CN 2-12 grossly intact except right cheek numbness, 5/5 in all extremities, FTN intact   I have reviewed labs in epic and the results pertinent to this consultation are: CBC:  Recent Labs  Lab 04/05/21 1145  WBC 8.8  HGB 14.0  HCT 41.5  MCV 86.8  PLT 244    Basic Metabolic Panel:  Lab Results  Component Value Date   NA 140 04/06/2021   K 3.5 04/06/2021   CO2 26 04/06/2021   GLUCOSE 122 (H) 04/06/2021   BUN 7 04/06/2021   CREATININE 0.90 04/06/2021   CALCIUM 9.3 04/06/2021   GFRNONAA >60 04/06/2021   Lipid Panel: No results found for: LDLCALC HgbA1c: No results found for: HGBA1C Urine Drug Screen: No results found for: LABOPIA, COCAINSCRNUR,  LABBENZ, AMPHETMU, THCU, LABBARB  Alcohol Level No results found for: ETH   I have reviewed the images obtained: CT angio head and neck with and without contrast with normal positioning and with maximal extension 04/06/2021: No evidence of significant (greater than 50%) stenosis with neutral and extension positioning. 2. The left vertebral artery enters the left transverse foramen high at the C3 level.  MR Brain w and wo contrast 04/06/2021: Unremarkable MRI appearance of the brain. No evidence of acute intracranial abnormality. Minimal bilateral ethmoid sinus mucosal thickening. Trace fluid within the bilateral mastoid air cells.  MR C spine w and wo contrast 04/06/2021:  No appreciable signal abnormality within the cervical spinal cord. No abnormal spinal cord enhancement.  Cervical spondylosis, as outlined and with findings most notably as follows.At C5-C6, there is mild disc degeneration with degenerative endplate irregularity and mild degenerative endplate edema/enhancement. Disc bulge with bilateral uncovertebral hypertrophy. Mild relative spinal canal narrowing (without spinal cord mass effect). No significant foraminal stenosis.   No significant spinal canal or foraminal stenosis at the remaining levels. Straightening of the expected cervical lordosis.  ASSESSMENT/PLAN: 25 year old female with repeated episodes of passing out  Syncope, recurrent Transient alteration of awareness Chronic migraine with aura - EKG showed normal sinus rhythm - No acute intracranial abnormality - No evidence of carotid stenosis - No evidence of orthostatic hypotension  - Symptoms happen predominantly when patient is upright and can be associated with headache. Ddx include orthostatic syncope vs due to migraine vs POTS vs less likely BPPV vs cardiogenic  Recommendations: -EEG ordered and pending -We will also obtain TTE to assess for any cardiogenic causes - Will start patient on nortriptyline 10mg   qhs for chronic migraine - If symptoms persist, can consider 30 day holter monitor for arrythmia and tilt table testing as outpatient for POTS - PT/OT   Thank you for allowing to participate in the care of this patient. If you have any further questions, please contact  me or neurohospitalist.   [place] [facility]

## 2021-04-06 NOTE — Progress Notes (Addendum)
PROGRESS NOTE    Jenna Glover  QQP:[id number] DOB: 1996/02/21 DOA: 04/05/2021 PCP: Pcp, No  Brief Narrative: 25/F with no significant past medical history was admitted earlier this morning for recurrent episodes of syncope in the last 3 to 4 months. -Patient reports ongoing history of intermittent dizziness, headaches, episodes of passing out often triggered by extending her neck or head backwards followed by numbness across her whole body after this she usually passes out.  Following this is usually a bit groggy, denies any tongue biting bowel or bladder incontinence, over the past few months she also has had episodes where her hands have gotten weak spontaneously causing her to drop things. -Multiple episodes of passing out worse in the last week -Denies any new medications, smokes a small amount of cannabis daily, denies any other illicit drugs or alcohol -In the ED she had a CT head and CTA head and neck which were unremarkable, labs were unrevealing as well   Assessment & Plan:   Recurrent syncope -Etiology remains unclear, EKG and telemetry eval is within normal limits -Check orthostatics -Will request neurology input, MRI brain and C-spine are pending -Check EEG -Ambulate, PT eval -Continue to monitor on telemetry   DVT prophylaxis: SCDs Code Status: Full code Family Communication: Spouse sleeping at bedside Disposition Plan:  Status is: Observation   Consultants:  Neurology  Procedures:   Antimicrobials:    Subjective: -Feels okay this morning, bit tired from being in the ED all night  Objective: Vitals:   04/06/21 0000 04/06/21 0015 04/06/21 0251 04/06/21 0815  BP: 113/78 (!) 112/101 (!) 141/80 106/64  Pulse: 71 64 79 65  Resp: 18 15 18 20   Temp: 98.4 F (36.9 C)  98.5 F (36.9 C) 98.1 F (36.7 C)  TempSrc:   Oral Oral  SpO2: 99% 99% 99% 100%  Weight:      Height:       No intake or output data in the 24 hours ending 04/06/21 1110 Filed Weights    04/05/21 1128  Weight: 59 kg    Examination:  General exam: Appears calm and comfortable, AAOx3, no distress Respiratory system: Clear to auscultation Cardiovascular system: S1 & S2 heard, RRR.  Abd: nondistended, soft and nontender.Normal bowel sounds heard. Central nervous system: Alert and oriented. No focal neurological deficits. Extremities: no edema Skin: No rashes Psychiatry:  Mood & affect appropriate.     Data Reviewed:   CBC: Recent Labs  Lab 04/05/21 1145  WBC 8.8  HGB 14.0  HCT 41.5  MCV 86.8  PLT 244   Basic Metabolic Panel: Recent Labs  Lab 04/05/21 1145 04/06/21 0355  NA 136 140  K 4.1 3.5  CL 106 106  CO2 21* 26  GLUCOSE 87 122*  BUN 10 7  CREATININE 0.64 0.90  CALCIUM 9.1 9.3  MG 2.1  --    GFR: Estimated Creatinine Clearance: 79 mL/min (by C-G formula based on SCr of 0.9 mg/dL). Liver Function Tests: Recent Labs  Lab 04/05/21 1145  AST 13*  ALT 9  ALKPHOS 35*  BILITOT 0.4  PROT 7.3  ALBUMIN 4.6   No results for input(s): LIPASE, AMYLASE in the last 168 hours. No results for input(s): AMMONIA in the last 168 hours. Coagulation Profile: Recent Labs  Lab 04/05/21 1847  INR 1.0   Cardiac Enzymes: No results for input(s): CKTOTAL, CKMB, CKMBINDEX, TROPONINI in the last 168 hours. BNP (last 3 results) No results for input(s): PROBNP in the last 8760 hours. HbA1C: No  results for input(s): HGBA1C in the last 72 hours. CBG: Recent Labs  Lab 04/05/21 1143  GLUCAP 85   Lipid Profile: No results for input(s): CHOL, HDL, LDLCALC, TRIG, CHOLHDL, LDLDIRECT in the last 72 hours. Thyroid Function Tests: Recent Labs    04/05/21 1847  TSH 4.027   Anemia Panel: No results for input(s): VITAMINB12, FOLATE, FERRITIN, TIBC, IRON, RETICCTPCT in the last 72 hours. Urine analysis:    Component Value Date/Time   COLORURINE YELLOW 04/05/2021 1500   APPEARANCEUR HAZY (A) 04/05/2021 1500   LABSPEC 1.006 04/05/2021 1500   PHURINE 5.5  04/05/2021 1500   GLUCOSEU NEGATIVE 04/05/2021 1500   HGBUR NEGATIVE 04/05/2021 1500   BILIRUBINUR NEGATIVE 04/05/2021 1500   BILIRUBINUR negative 06/21/2016 1707   KETONESUR NEGATIVE 04/05/2021 1500   PROTEINUR NEGATIVE 04/05/2021 1500   UROBILINOGEN 0.2 06/21/2016 1707   NITRITE POSITIVE (A) 04/05/2021 1500   LEUKOCYTESUR LARGE (A) 04/05/2021 1500   Sepsis Labs: @LABRCNTIP (procalcitonin:4,lacticidven:4)  ) Recent Results (from the past 240 hour(s))  Resp Panel by RT-PCR (Flu A&B, Covid) Nasopharyngeal Swab     Status: None   Collection Time: 04/05/21  6:47 PM   Specimen: Nasopharyngeal Swab; Nasopharyngeal(NP) swabs in vial transport medium  Result Value Ref Range Status   SARS Coronavirus 2 by RT PCR NEGATIVE NEGATIVE Final    Comment: (NOTE) SARS-CoV-2 target nucleic acids are NOT DETECTED.  The SARS-CoV-2 RNA is generally detectable in upper respiratory specimens during the acute phase of infection. The lowest concentration of SARS-CoV-2 viral copies this assay can detect is 138 copies/mL. A negative result does not preclude SARS-Cov-2 infection and should not be used as the sole basis for treatment or other patient management decisions. A negative result may occur with  improper specimen collection/handling, submission of specimen other than nasopharyngeal swab, presence of viral mutation(s) within the areas targeted by this assay, and inadequate number of viral copies(<138 copies/mL). A negative result must be combined with clinical observations, patient history, and epidemiological information. The expected result is Negative.  Fact Sheet for Patients:  04/07/21  Fact Sheet for Healthcare Providers:  BloggerCourse.com  This test is no t yet approved or cleared by the [employer] FDA and  has been authorized for detection and/or diagnosis of SARS-CoV-2 by FDA under an Emergency Use Authorization (EUA). This  EUA will remain  in effect (meaning this test can be used) for the duration of the COVID-19 declaration under Section 564(b)(1) of the Act, 21 U.S.C.section 360bbb-3(b)(1), unless the authorization is terminated  or revoked sooner.       Influenza A by PCR NEGATIVE NEGATIVE Final   Influenza B by PCR NEGATIVE NEGATIVE Final    Comment: (NOTE) The Xpert Xpress SARS-CoV-2/FLU/RSV plus assay is intended as an aid in the diagnosis of influenza from Nasopharyngeal swab specimens and should not be used as a sole basis for treatment. Nasal washings and aspirates are unacceptable for Xpert Xpress SARS-CoV-2/FLU/RSV testing.  Fact Sheet for Patients: [facility]  Fact Sheet for Healthcare Providers: BloggerCourse.com  This test is not yet approved or cleared by the [employer] FDA and has been authorized for detection and/or diagnosis of SARS-CoV-2 by FDA under an Emergency Use Authorization (EUA). This EUA will remain in effect (meaning this test can be used) for the duration of the COVID-19 declaration under Section 564(b)(1) of the Act, 21 U.S.C. section 360bbb-3(b)(1), unless the authorization is terminated or revoked.  Performed at [facility], [address] [place], [place]  Radiology Studies: CT Angio Head W or Wo Contrast  Result Date: 04/05/2021 CLINICAL DATA:  Syncope, recurrent Per neurology, CTA head and neck both with normal positioning and with maximal neck extension as this has provoked patient's syncope. EXAM: CT ANGIOGRAPHY HEAD AND NECK TECHNIQUE: Multidetector CT imaging of the head and neck was performed using the standard protocol during bolus administration of intravenous contrast. Multiplanar CT image reconstructions and MIPs were obtained to evaluate the vascular anatomy. Carotid stenosis measurements (when applicable) are obtained utilizing NASCET criteria,  using the distal internal carotid diameter as the denominator. CTA of the neck was performed in neutral position and extension due to the patient's worsening syncope with extension. CONTRAST:  60mL OMNIPAQUE IOHEXOL 350 MG/ML SOLN, 24mL OMNIPAQUE IOHEXOL 350 MG/ML SOLN COMPARISON:  None. FINDINGS: CT HEAD FINDINGS Brain: No evidence of acute large vascular territory infarct. Infarction, hemorrhage, hydrocephalus, extra-axial collection or mass lesion/mass effect. Vascular: See below. Skull: No acute fracture. Sinuses: Visualized sinuses are clear. Orbits: No acute findings in the visualized orbits. Review of the MIP images confirms the above findings CTA NECK FINDINGS Aortic arch: Great vessel origins are patent. Right carotid system: No evidence of dissection, stenosis (50% or greater) or occlusion on either neutral or extension imaging. The left. Left carotid system: No evidence of dissection, stenosis (50% or greater) or occlusion on either neutral or extension imaging. Vertebral arteries: Codominant. No evidence of dissection, stenosis (50% or greater) or occlusion. The left vertebral artery enters the left transverse foramen high at the C3 level. Skeleton: Mild degenerative change. Other neck: No evidence of acute abnormality. Reversal of the normal cervical lordosis. Upper chest: Visualized lung apices are clear. Review of the MIP images confirms the above findings CTA HEAD FINDINGS Anterior circulation: Bilateral intracranial ICAs, MCAs, and ACAs are patent without proximal hemodynamically significant stenosis. Limited distal vessel evaluation due to venous contamination. Posterior circulation: Bilateral intradural vertebral arteries, basilar artery, and posterior cerebral arteries are patent without proximal hemodynamically significant stenosis. Venous sinuses: As permitted by contrast timing, patent. Review of the MIP images confirms the above findings IMPRESSION: CT head No evidence of acute intracranial  abnormality. CTA head: No large vessel occlusion or proximal hemodynamically significant stenosis. Limited evaluation due to venous contamination. CTA neck: 1. No evidence of significant (greater than 50%) stenosis with neutral and extension positioning. 2. The left vertebral artery enters the left transverse foramen high at the C3 level. Electronically Signed   By: Feliberto Harts M.D.   On: 04/05/2021 20:14   DG Chest 2 View  Result Date: 04/05/2021 CLINICAL DATA:  Recurrent syncope EXAM: CHEST - 2 VIEW COMPARISON:  None. FINDINGS: The heart size and mediastinal contours are within normal limits. Both lungs are clear. The visualized skeletal structures are unremarkable. IMPRESSION: Normal study. Electronically Signed   By: Charlett Nose M.D.   On: 04/05/2021 18:47   CT ANGIO NECK W OR WO CONTRAST  Result Date: 04/05/2021 CLINICAL DATA:  Syncope, recurrent Per neurology, CTA head and neck both with normal positioning and with maximal neck extension as this has provoked patient's syncope. EXAM: CT ANGIOGRAPHY HEAD AND NECK TECHNIQUE: Multidetector CT imaging of the head and neck was performed using the standard protocol during bolus administration of intravenous contrast. Multiplanar CT image reconstructions and MIPs were obtained to evaluate the vascular anatomy. Carotid stenosis measurements (when applicable) are obtained utilizing NASCET criteria, using the distal internal carotid diameter as the denominator. CTA of the neck was performed in neutral position and extension  due to the patient's worsening syncope with extension. CONTRAST:  92mL OMNIPAQUE IOHEXOL 350 MG/ML SOLN, 8mL OMNIPAQUE IOHEXOL 350 MG/ML SOLN COMPARISON:  None. FINDINGS: CT HEAD FINDINGS Brain: No evidence of acute large vascular territory infarct. Infarction, hemorrhage, hydrocephalus, extra-axial collection or mass lesion/mass effect. Vascular: See below. Skull: No acute fracture. Sinuses: Visualized sinuses are clear. Orbits: No  acute findings in the visualized orbits. Review of the MIP images confirms the above findings CTA NECK FINDINGS Aortic arch: Great vessel origins are patent. Right carotid system: No evidence of dissection, stenosis (50% or greater) or occlusion on either neutral or extension imaging. The left. Left carotid system: No evidence of dissection, stenosis (50% or greater) or occlusion on either neutral or extension imaging. Vertebral arteries: Codominant. No evidence of dissection, stenosis (50% or greater) or occlusion. The left vertebral artery enters the left transverse foramen high at the C3 level. Skeleton: Mild degenerative change. Other neck: No evidence of acute abnormality. Reversal of the normal cervical lordosis. Upper chest: Visualized lung apices are clear. Review of the MIP images confirms the above findings CTA HEAD FINDINGS Anterior circulation: Bilateral intracranial ICAs, MCAs, and ACAs are patent without proximal hemodynamically significant stenosis. Limited distal vessel evaluation due to venous contamination. Posterior circulation: Bilateral intradural vertebral arteries, basilar artery, and posterior cerebral arteries are patent without proximal hemodynamically significant stenosis. Venous sinuses: As permitted by contrast timing, patent. Review of the MIP images confirms the above findings IMPRESSION: CT head No evidence of acute intracranial abnormality. CTA head: No large vessel occlusion or proximal hemodynamically significant stenosis. Limited evaluation due to venous contamination. CTA neck: 1. No evidence of significant (greater than 50%) stenosis with neutral and extension positioning. 2. The left vertebral artery enters the left transverse foramen high at the C3 level. Electronically Signed   By: Feliberto Harts M.D.   On: 04/05/2021 20:14   CT Angio Neck W and/or Wo Contrast  Result Date: 04/05/2021 CLINICAL DATA:  Syncope, recurrent Per neurology, CTA head and neck both with normal  positioning and with maximal neck extension as this has provoked patient's syncope. EXAM: CT ANGIOGRAPHY HEAD AND NECK TECHNIQUE: Multidetector CT imaging of the head and neck was performed using the standard protocol during bolus administration of intravenous contrast. Multiplanar CT image reconstructions and MIPs were obtained to evaluate the vascular anatomy. Carotid stenosis measurements (when applicable) are obtained utilizing NASCET criteria, using the distal internal carotid diameter as the denominator. CTA of the neck was performed in neutral position and extension due to the patient's worsening syncope with extension. CONTRAST:  58mL OMNIPAQUE IOHEXOL 350 MG/ML SOLN, 44mL OMNIPAQUE IOHEXOL 350 MG/ML SOLN COMPARISON:  None. FINDINGS: CT HEAD FINDINGS Brain: No evidence of acute large vascular territory infarct. Infarction, hemorrhage, hydrocephalus, extra-axial collection or mass lesion/mass effect. Vascular: See below. Skull: No acute fracture. Sinuses: Visualized sinuses are clear. Orbits: No acute findings in the visualized orbits. Review of the MIP images confirms the above findings CTA NECK FINDINGS Aortic arch: Great vessel origins are patent. Right carotid system: No evidence of dissection, stenosis (50% or greater) or occlusion on either neutral or extension imaging. The left. Left carotid system: No evidence of dissection, stenosis (50% or greater) or occlusion on either neutral or extension imaging. Vertebral arteries: Codominant. No evidence of dissection, stenosis (50% or greater) or occlusion. The left vertebral artery enters the left transverse foramen high at the C3 level. Skeleton: Mild degenerative change. Other neck: No evidence of acute abnormality. Reversal of the normal cervical lordosis.  Upper chest: Visualized lung apices are clear. Review of the MIP images confirms the above findings CTA HEAD FINDINGS Anterior circulation: Bilateral intracranial ICAs, MCAs, and ACAs are patent without  proximal hemodynamically significant stenosis. Limited distal vessel evaluation due to venous contamination. Posterior circulation: Bilateral intradural vertebral arteries, basilar artery, and posterior cerebral arteries are patent without proximal hemodynamically significant stenosis. Venous sinuses: As permitted by contrast timing, patent. Review of the MIP images confirms the above findings IMPRESSION: CT head No evidence of acute intracranial abnormality. CTA head: No large vessel occlusion or proximal hemodynamically significant stenosis. Limited evaluation due to venous contamination. CTA neck: 1. No evidence of significant (greater than 50%) stenosis with neutral and extension positioning. 2. The left vertebral artery enters the left transverse foramen high at the C3 level. Electronically Signed   By: Feliberto Harts M.D.   On: 04/05/2021 20:14        Scheduled Meds:  sodium chloride flush  3 mL Intravenous Q12H   Continuous Infusions:  sodium chloride       LOS: 0 days    Time spent:   Zannie Cove, MD [facility]   04/06/2021, 11:10 AM

## 2021-04-07 ENCOUNTER — Observation Stay (HOSPITAL_BASED_OUTPATIENT_CLINIC_OR_DEPARTMENT_OTHER): Payer: Medicaid Other

## 2021-04-07 DIAGNOSIS — R55 Syncope and collapse: Secondary | ICD-10-CM

## 2021-04-07 LAB — ECHOCARDIOGRAM COMPLETE
AR max vel: 2.25 cm2
AV Peak grad: 5.2 mmHg
Ao pk vel: 1.14 m/s
Area-P 1/2: 4.15 cm2
Calc EF: 56.2 %
S' Lateral: 2.6 cm
Single Plane A2C EF: 55.8 %
Single Plane A4C EF: 56.6 %

## 2021-04-07 LAB — URINE CULTURE: Culture: 90000 — AB

## 2021-04-07 MED ORDER — NORTRIPTYLINE HCL 10 MG PO CAPS: 10.0000 mg | ORAL_CAPSULE | Freq: Every day | ORAL | 0 refills | Status: AC

## 2021-04-07 MED ORDER — NORTRIPTYLINE HCL 10 MG PO CAPS
10.0000 mg | ORAL_CAPSULE | Freq: Every day | ORAL | Status: DC
  Filled 2021-04-07: qty 1

## 2021-04-07 NOTE — Progress Notes (Signed)
Discharge instructions (including medications) discussed with and copy provided to patient/caregiver 

## 2021-04-07 NOTE — Procedures (Addendum)
Patient Name: Jenna Glover  MRN: [id number]  Epilepsy Attending: Charlsie Quest  Referring Physician/Provider: Dr Zannie Cove Date: 04/06/2021  Duration: 24.11 mins  Patient history:  25 year old female with repeated episodes of passing out.   Level of alertness: Awake, asleep  AEDs during EEG study: None  Technical aspects: This EEG study was done with scalp electrodes positioned according to the 10-20 International system of electrode placement. Electrical activity was acquired at a sampling rate of 500Hz  and reviewed with a high frequency filter of 70Hz  and a low frequency filter of 1Hz . EEG data were recorded continuously and digitally stored.   Description: The posterior dominant rhythm consists of 10 Hz activity of moderate voltage (25-35 uV) seen predominantly in posterior head regions, symmetric and reactive to eye opening and eye closing. Sleep was characterized by vertex waves, sleep spindles (12 to 14 Hz), maximal frontocentral region. Hyperventilation and photic stimulation were not performed.     IMPRESSION: This study is within normal limits. No seizures or epileptiform discharges were seen throughout the recording.  Trust Crago 

## 2021-04-07 NOTE — Progress Notes (Addendum)
PROGRESS NOTE    Jenna Glover  ZOX:[id number] DOB: 11-04-1995 DOA: 04/05/2021 PCP: Pcp, No  Brief Narrative: 25/F with no significant past medical history was admitted earlier this morning for recurrent episodes of syncope in the last 3 to 4 months. -Patient reports ongoing history of intermittent dizziness, headaches, episodes of passing out often triggered by extending her neck or head backwards followed by numbness across her whole body after this she usually passes out.  Following this is usually a bit groggy, denies any tongue biting bowel or bladder incontinence, over the past few months she also has had episodes where her hands have gotten weak spontaneously causing her to drop things. -Multiple episodes of passing out worse in the last week -Denies any new medications, smokes a small amount of cannabis daily, denies any other illicit drugs or alcohol -In the ED she had a CT head and CTA head and neck which were unremarkable, labs were unrevealing as well   Assessment & Plan:   Recurrent syncope and nearsyncope -Etiology remains unclear, EKG and telemetry eval is within normal limits -Orthostatics negative -MRI brain and C-spine were unremarkable -EEG done, results pending -2D echo with preserved EF, no valvular disease noted -Appreciate neurology input, -PT eval today -if above workup is negative, POTTs may need to be a consideration  DVT prophylaxis: SCDs Code Status: Full code Family Communication: No family at bedside Disposition Plan:  Status is: Observation   Consultants:  Neurology  Procedures:   Antimicrobials:    Subjective: -Feels okay this morning, denies any issues overnight  Objective: Vitals:   04/06/21 1148 04/06/21 1945 04/07/21 0700 04/07/21 0800  BP: 110/61 114/75    Pulse: 85 82    Resp: Temp: 98 F (36.7 C) (!) 97.5 F (36.4 C)    TempSrc: Oral Oral    SpO2: 96% 98%    Weight:      Height:        Intake/Output Summary  (Last 24 hours) at 04/07/2021 1041 Last data filed at 04/06/2021 1400 Gross per 24 hour  Intake 240 ml  Output --  Net 240 ml   Filed Weights   04/05/21 1128  Weight: 59 kg    Examination:  Gen: Awake, Alert, Oriented X 3,  HEENT: no JVD Lungs: Good air movement bilaterally, CTAB CVS: S1S2/RRR Abd: soft, Non tender, non distended, BS present Extremities: No edema Skin: no new rashes on exposed skin  Psychiatry:  Mood & affect appropriate.     Data Reviewed:   CBC: Recent Labs  Lab 04/05/21 1145  WBC 8.8  HGB 14.0  HCT 41.5  MCV 86.8  PLT 244   Basic Metabolic Panel: Recent Labs  Lab 04/05/21 1145 04/06/21 0355  NA 136 140  K 4.1 3.5  CL 106 106  CO2 21* 26  GLUCOSE 87 122*  BUN 10 7  CREATININE 0.64 0.90  CALCIUM 9.1 9.3  MG 2.1  --    GFR: Estimated Creatinine Clearance: 79 mL/min (by C-G formula based on SCr of 0.9 mg/dL). Liver Function Tests: Recent Labs  Lab 04/05/21 1145  AST 13*  ALT 9  ALKPHOS 35*  BILITOT 0.4  PROT 7.3  ALBUMIN 4.6   No results for input(s): LIPASE, AMYLASE in the last 168 hours. No results for input(s): AMMONIA in the last 168 hours. Coagulation Profile: Recent Labs  Lab 04/05/21 1847  INR 1.0   Cardiac Enzymes: No results for input(s): CKTOTAL, CKMB, CKMBINDEX, TROPONINI in  the last 168 hours. BNP (last 3 results) No results for input(s): PROBNP in the last 8760 hours. HbA1C: No results for input(s): HGBA1C in the last 72 hours. CBG: Recent Labs  Lab 04/05/21 1143  GLUCAP 85   Lipid Profile: No results for input(s): CHOL, HDL, LDLCALC, TRIG, CHOLHDL, LDLDIRECT in the last 72 hours. Thyroid Function Tests: Recent Labs    04/05/21 1847  TSH 4.027   Anemia Panel: No results for input(s): VITAMINB12, FOLATE, FERRITIN, TIBC, IRON, RETICCTPCT in the last 72 hours. Urine analysis:    Component Value Date/Time   COLORURINE YELLOW 04/05/2021 1500   APPEARANCEUR HAZY (A) 04/05/2021 1500   LABSPEC  1.006 04/05/2021 1500   PHURINE 5.5 04/05/2021 1500   GLUCOSEU NEGATIVE 04/05/2021 1500   HGBUR NEGATIVE 04/05/2021 1500   BILIRUBINUR NEGATIVE 04/05/2021 1500   BILIRUBINUR negative 06/21/2016 1707   KETONESUR NEGATIVE 04/05/2021 1500   PROTEINUR NEGATIVE 04/05/2021 1500   UROBILINOGEN 0.2 06/21/2016 1707   NITRITE POSITIVE (A) 04/05/2021 1500   LEUKOCYTESUR LARGE (A) 04/05/2021 1500   Sepsis Labs: @LABRCNTIP (procalcitonin:4,lacticidven:4)  ) Recent Results (from the past 240 hour(s))  Urine Culture     Status: Abnormal   Collection Time: 04/05/21  3:00 PM   Specimen: Urine, Clean Catch  Result Value Ref Range Status   Specimen Description   Final    URINE, CLEAN CATCH Performed at [facility], [address] [place], [place]    Special Requests   Final    NONE Performed at [facility], [address], [place], [place]    Culture 90,000 COLONIES/mL ESCHERICHIA COLI (A)  Final   Report Status 04/07/2021 FINAL  Final   Organism ID, Bacteria ESCHERICHIA COLI (A)  Final      Susceptibility   Escherichia coli - MIC*    AMPICILLIN 4 SENSITIVE Sensitive     CEFAZOLIN <=4 SENSITIVE Sensitive     CEFEPIME <=0.12 SENSITIVE Sensitive     CEFTRIAXONE <=0.25 SENSITIVE Sensitive     CIPROFLOXACIN <=0.25 SENSITIVE Sensitive     GENTAMICIN <=1 SENSITIVE Sensitive     IMIPENEM <=0.25 SENSITIVE Sensitive     NITROFURANTOIN <=16 SENSITIVE Sensitive     TRIMETH/SULFA <=20 SENSITIVE Sensitive     AMPICILLIN/SULBACTAM <=2 SENSITIVE Sensitive     PIP/TAZO <=4 SENSITIVE Sensitive     * 90,000 COLONIES/mL ESCHERICHIA COLI  Resp Panel by RT-PCR (Flu A&B, Covid) Nasopharyngeal Swab     Status: None   Collection Time: 04/05/21  6:47 PM   Specimen: Nasopharyngeal Swab; Nasopharyngeal(NP) swabs in vial transport medium  Result Value Ref Range Status   SARS Coronavirus 2 by RT PCR NEGATIVE NEGATIVE Final    Comment:  (NOTE) SARS-CoV-2 target nucleic acids are NOT DETECTED.  The SARS-CoV-2 RNA is generally detectable in upper respiratory specimens during the acute phase of infection. The lowest concentration of SARS-CoV-2 viral copies this assay can detect is 138 copies/mL. A negative result does not preclude SARS-Cov-2 infection and should not be used as the sole basis for treatment or other patient management decisions. A negative result may occur with  improper specimen collection/handling, submission of specimen other than nasopharyngeal swab, presence of viral mutation(s) within the areas targeted by this assay, and inadequate number of viral copies(<138 copies/mL). A negative result must be combined with clinical observations, patient history, and epidemiological information. The expected result is Negative.  Fact Sheet for Patients:  04/07/21  Fact Sheet for Healthcare Providers:  BloggerCourse.com  This  test is no t yet approved or cleared by the [place] and  has been authorized for detection and/or diagnosis of SARS-CoV-2 by FDA under an Emergency Use Authorization (EUA). This EUA will remain  in effect (meaning this test can be used) for the duration of the COVID-19 declaration under Section 564(b)(1) of the Act, 21 U.S.C.section 360bbb-3(b)(1), unless the authorization is terminated  or revoked sooner.       Influenza A by PCR NEGATIVE NEGATIVE Final   Influenza B by PCR NEGATIVE NEGATIVE Final    Comment: (NOTE) The Xpert Xpress SARS-CoV-2/FLU/RSV plus assay is intended as an aid in the diagnosis of influenza from Nasopharyngeal swab specimens and should not be used as a sole basis for treatment. Nasal washings and aspirates are unacceptable for Xpert Xpress SARS-CoV-2/FLU/RSV testing.  Fact Sheet for Patients: BloggerCourse.com  Fact Sheet for Healthcare  Providers: SeriousBroker.it  This test is not yet approved or cleared by the [place] FDA and has been authorized for detection and/or diagnosis of SARS-CoV-2 by FDA under an Emergency Use Authorization (EUA). This EUA will remain in effect (meaning this test can be used) for the duration of the COVID-19 declaration under Section 564(b)(1) of the Act, 21 U.S.C. section 360bbb-3(b)(1), unless the authorization is terminated or revoked.  Performed at [facility], [address], [place], [place] [address]          Radiology Studies: CT Angio Head W or Wo Contrast  Result Date: 04/05/2021 CLINICAL DATA:  Syncope, recurrent Per neurology, CTA head and neck both with normal positioning and with maximal neck extension as this has provoked patient's syncope. EXAM: CT ANGIOGRAPHY HEAD AND NECK TECHNIQUE: Multidetector CT imaging of the head and neck was performed using the standard protocol during bolus administration of intravenous contrast. Multiplanar CT image reconstructions and MIPs were obtained to evaluate the vascular anatomy. Carotid stenosis measurements (when applicable) are obtained utilizing NASCET criteria, using the distal internal carotid diameter as the denominator. CTA of the neck was performed in neutral position and extension due to the patient's worsening syncope with extension. CONTRAST:  66mL OMNIPAQUE IOHEXOL 350 MG/ML SOLN, 52mL OMNIPAQUE IOHEXOL 350 MG/ML SOLN COMPARISON:  None. FINDINGS: CT HEAD FINDINGS Brain: No evidence of acute large vascular territory infarct. Infarction, hemorrhage, hydrocephalus, extra-axial collection or mass lesion/mass effect. Vascular: See below. Skull: No acute fracture. Sinuses: Visualized sinuses are clear. Orbits: No acute findings in the visualized orbits. Review of the MIP images confirms the above findings CTA NECK FINDINGS Aortic arch: Great vessel origins are patent. Right carotid  system: No evidence of dissection, stenosis (50% or greater) or occlusion on either neutral or extension imaging. The left. Left carotid system: No evidence of dissection, stenosis (50% or greater) or occlusion on either neutral or extension imaging. Vertebral arteries: Codominant. No evidence of dissection, stenosis (50% or greater) or occlusion. The left vertebral artery enters the left transverse foramen high at the C3 level. Skeleton: Mild degenerative change. Other neck: No evidence of acute abnormality. Reversal of the normal cervical lordosis. Upper chest: Visualized lung apices are clear. Review of the MIP images confirms the above findings CTA HEAD FINDINGS Anterior circulation: Bilateral intracranial ICAs, MCAs, and ACAs are patent without proximal hemodynamically significant stenosis. Limited distal vessel evaluation due to venous contamination. Posterior circulation: Bilateral intradural vertebral arteries, basilar artery, and posterior cerebral arteries are patent without proximal hemodynamically significant stenosis. Venous sinuses: As permitted by contrast timing, patent. Review of the MIP images confirms the above  findings IMPRESSION: CT head No evidence of acute intracranial abnormality. CTA head: No large vessel occlusion or proximal hemodynamically significant stenosis. Limited evaluation due to venous contamination. CTA neck: 1. No evidence of significant (greater than 50%) stenosis with neutral and extension positioning. 2. The left vertebral artery enters the left transverse foramen high at the C3 level. Electronically Signed   By: Feliberto Harts M.D.   On: 04/05/2021 20:14   DG Chest 2 View  Result Date: 04/05/2021 CLINICAL DATA:  Recurrent syncope EXAM: CHEST - 2 VIEW COMPARISON:  None. FINDINGS: The heart size and mediastinal contours are within normal limits. Both lungs are clear. The visualized skeletal structures are unremarkable. IMPRESSION: Normal study. Electronically Signed    By: Charlett Nose M.D.   On: 04/05/2021 18:47   CT ANGIO NECK W OR WO CONTRAST  Result Date: 04/05/2021 CLINICAL DATA:  Syncope, recurrent Per neurology, CTA head and neck both with normal positioning and with maximal neck extension as this has provoked patient's syncope. EXAM: CT ANGIOGRAPHY HEAD AND NECK TECHNIQUE: Multidetector CT imaging of the head and neck was performed using the standard protocol during bolus administration of intravenous contrast. Multiplanar CT image reconstructions and MIPs were obtained to evaluate the vascular anatomy. Carotid stenosis measurements (when applicable) are obtained utilizing NASCET criteria, using the distal internal carotid diameter as the denominator. CTA of the neck was performed in neutral position and extension due to the patient's worsening syncope with extension. CONTRAST:  67mL OMNIPAQUE IOHEXOL 350 MG/ML SOLN, 65mL OMNIPAQUE IOHEXOL 350 MG/ML SOLN COMPARISON:  None. FINDINGS: CT HEAD FINDINGS Brain: No evidence of acute large vascular territory infarct. Infarction, hemorrhage, hydrocephalus, extra-axial collection or mass lesion/mass effect. Vascular: See below. Skull: No acute fracture. Sinuses: Visualized sinuses are clear. Orbits: No acute findings in the visualized orbits. Review of the MIP images confirms the above findings CTA NECK FINDINGS Aortic arch: Great vessel origins are patent. Right carotid system: No evidence of dissection, stenosis (50% or greater) or occlusion on either neutral or extension imaging. The left. Left carotid system: No evidence of dissection, stenosis (50% or greater) or occlusion on either neutral or extension imaging. Vertebral arteries: Codominant. No evidence of dissection, stenosis (50% or greater) or occlusion. The left vertebral artery enters the left transverse foramen high at the C3 level. Skeleton: Mild degenerative change. Other neck: No evidence of acute abnormality. Reversal of the normal cervical lordosis. Upper  chest: Visualized lung apices are clear. Review of the MIP images confirms the above findings CTA HEAD FINDINGS Anterior circulation: Bilateral intracranial ICAs, MCAs, and ACAs are patent without proximal hemodynamically significant stenosis. Limited distal vessel evaluation due to venous contamination. Posterior circulation: Bilateral intradural vertebral arteries, basilar artery, and posterior cerebral arteries are patent without proximal hemodynamically significant stenosis. Venous sinuses: As permitted by contrast timing, patent. Review of the MIP images confirms the above findings IMPRESSION: CT head No evidence of acute intracranial abnormality. CTA head: No large vessel occlusion or proximal hemodynamically significant stenosis. Limited evaluation due to venous contamination. CTA neck: 1. No evidence of significant (greater than 50%) stenosis with neutral and extension positioning. 2. The left vertebral artery enters the left transverse foramen high at the C3 level. Electronically Signed   By: Feliberto Harts M.D.   On: 04/05/2021 20:14   CT Angio Neck W and/or Wo Contrast  Result Date: 04/05/2021 CLINICAL DATA:  Syncope, recurrent Per neurology, CTA head and neck both with normal positioning and with maximal neck extension as this has provoked  patient's syncope. EXAM: CT ANGIOGRAPHY HEAD AND NECK TECHNIQUE: Multidetector CT imaging of the head and neck was performed using the standard protocol during bolus administration of intravenous contrast. Multiplanar CT image reconstructions and MIPs were obtained to evaluate the vascular anatomy. Carotid stenosis measurements (when applicable) are obtained utilizing NASCET criteria, using the distal internal carotid diameter as the denominator. CTA of the neck was performed in neutral position and extension due to the patient's worsening syncope with extension. CONTRAST:  37mL OMNIPAQUE IOHEXOL 350 MG/ML SOLN, 98mL OMNIPAQUE IOHEXOL 350 MG/ML SOLN COMPARISON:   None. FINDINGS: CT HEAD FINDINGS Brain: No evidence of acute large vascular territory infarct. Infarction, hemorrhage, hydrocephalus, extra-axial collection or mass lesion/mass effect. Vascular: See below. Skull: No acute fracture. Sinuses: Visualized sinuses are clear. Orbits: No acute findings in the visualized orbits. Review of the MIP images confirms the above findings CTA NECK FINDINGS Aortic arch: Great vessel origins are patent. Right carotid system: No evidence of dissection, stenosis (50% or greater) or occlusion on either neutral or extension imaging. The left. Left carotid system: No evidence of dissection, stenosis (50% or greater) or occlusion on either neutral or extension imaging. Vertebral arteries: Codominant. No evidence of dissection, stenosis (50% or greater) or occlusion. The left vertebral artery enters the left transverse foramen high at the C3 level. Skeleton: Mild degenerative change. Other neck: No evidence of acute abnormality. Reversal of the normal cervical lordosis. Upper chest: Visualized lung apices are clear. Review of the MIP images confirms the above findings CTA HEAD FINDINGS Anterior circulation: Bilateral intracranial ICAs, MCAs, and ACAs are patent without proximal hemodynamically significant stenosis. Limited distal vessel evaluation due to venous contamination. Posterior circulation: Bilateral intradural vertebral arteries, basilar artery, and posterior cerebral arteries are patent without proximal hemodynamically significant stenosis. Venous sinuses: As permitted by contrast timing, patent. Review of the MIP images confirms the above findings IMPRESSION: CT head No evidence of acute intracranial abnormality. CTA head: No large vessel occlusion or proximal hemodynamically significant stenosis. Limited evaluation due to venous contamination. CTA neck: 1. No evidence of significant (greater than 50%) stenosis with neutral and extension positioning. 2. The left vertebral artery  enters the left transverse foramen high at the C3 level. Electronically Signed   By: Feliberto Harts M.D.   On: 04/05/2021 20:14   MR Brain W and Wo Contrast  Result Date: 04/06/2021 CLINICAL DATA:  Syncope, recurrent. Numbness and tingling in extremities. EXAM: MRI HEAD WITHOUT AND WITH CONTRAST TECHNIQUE: Multiplanar, multiecho pulse sequences of the brain and surrounding structures were obtained without and with intravenous contrast. CONTRAST:  86mL GADAVIST GADOBUTROL 1 MMOL/ML IV SOLN COMPARISON:  CT angiogram head/neck 04/05/2021. FINDINGS: Brain: Mild intermittent motion degradation. No cortical encephalomalacia is identified. No significant cerebral white matter disease. There is no acute infarct. No evidence of an intracranial mass. No chronic intracranial blood products. No extra-axial fluid collection. No midline shift. No pathologic intracranial enhancement identified. Vascular: Maintained flow voids within the proximal large arterial vessels. Skull and upper cervical spine: No focal suspicious marrow lesion. Sinuses/Orbits: Visualized orbits show no acute finding. Minimal mucosal thickening within the anterior ethmoid sinuses bilaterally. Other: Trace fluid within the bilateral mastoid air cells. IMPRESSION: Mildly motion degraded exam. Unremarkable MRI appearance of the brain. No evidence of acute intracranial abnormality. Minimal bilateral ethmoid sinus mucosal thickening. Trace fluid within the bilateral mastoid air cells. Electronically Signed   By: Jackey Loge D.O.   On: 04/06/2021 16:23   MR Cervical Spine W or Wo Contrast  Result Date: 04/06/2021 CLINICAL DATA:  Numbness or tingling, paresthesia. Syncope with neck positioning, concern for Chiari versus cervical abnormality leading to syncopal episodes. EXAM: MRI CERVICAL SPINE WITHOUT AND WITH CONTRAST TECHNIQUE: Multiplanar and multiecho pulse sequences of the cervical spine, to include the craniocervical junction and cervicothoracic  junction, were obtained without and with intravenous contrast. CONTRAST:  6mL GADAVIST GADOBUTROL 1 MMOL/ML IV SOLN COMPARISON:  CT angiogram head/neck 04/05/2021. FINDINGS: Mildly motion degraded exam. Alignment: Straightening of the expected cervical lordosis. No significant spondylolisthesis. Vertebrae: Vertebral body height is maintained. Degenerative endplate irregularity at C5-C6. Associated mild degenerative endplate edema and enhancement at this level. Elsewhere, no significant marrow edema or focal suspicious osseous lesion is identified. Cord: Within the limitations of motion degradation, no signal abnormality is identified within the cervical spinal cord. No abnormal spinal cord enhancement. Posterior Fossa, vertebral arteries, paraspinal tissues: Posterior fossa better assessed on concurrently performed brain MRI. Flow voids preserved within the imaged cervical vertebral arteries. Paraspinal soft tissues unremarkable. Disc levels: Mild multilevel disc degeneration, greatest at C5-C6. C2-C3: No significant disc herniation or stenosis. C3-C4: No significant disc herniation or stenosis. C4-C5: Tiny left center disc protrusion. Uncovertebral hypertrophy on the right. No significant spinal canal stenosis or neural foraminal narrowing. C5-C6: Disc bulge with bilateral uncovertebral hypertrophy. Mild relative spinal canal narrowing (without spinal cord mass effect). No significant foraminal stenosis. C6-C7: Shallow broad-based left center disc protrusion. No significant spinal canal or foraminal stenosis. C7-T1: Shallow disc bulge. No significant spinal canal or foraminal stenosis. IMPRESSION: Mildly motion degraded exam. No appreciable signal abnormality within the cervical spinal cord. No abnormal spinal cord enhancement. Cervical spondylosis, as outlined and with findings most notably as follows. At C5-C6, there is mild disc degeneration with degenerative endplate irregularity and mild degenerative endplate  edema/enhancement. Disc bulge with bilateral uncovertebral hypertrophy. Mild relative spinal canal narrowing (without spinal cord mass effect). No significant foraminal stenosis. No significant spinal canal or foraminal stenosis at the remaining levels. Straightening of the expected cervical lordosis. Electronically Signed   By: Jackey Loge D.O.   On: 04/06/2021 16:15   EEG adult  Result Date: 04/07/2021 Charlsie Quest, MD     04/07/2021  8:47 AM Patient Name: Jenna Glover MRN: [id number] Epilepsy Attending: Charlsie Quest Referring Physician/Provider: Dr Zannie Cove Date: 04/06/2021 Duration: 24.11 mins Patient history:  25 year old female with repeated episodes of passing out. Level of alertness: Awake, asleep AEDs during EEG study: None Technical aspects: This EEG study was done with scalp electrodes positioned according to the 10-20 International system of electrode placement. Electrical activity was acquired at a sampling rate of  and reviewed with a high frequency filter of  and a low frequency filter of . EEG data were recorded continuously and digitally stored. Description: The posterior dominant rhythm consists of 10 Hz activity of moderate voltage (25-35 uV) seen predominantly in posterior head regions, symmetric and reactive to eye opening and eye closing. Sleep was characterized by vertex waves, sleep spindles (12 to 14 Hz), maximal frontocentral region. Hyperventilation and photic stimulation were not performed.   IMPRESSION: This study is within normal limits. No seizures or epileptiform discharges were seen throughout the recording. Charlsie Quest   ECHOCARDIOGRAM COMPLETE  Result Date: 04/07/2021    ECHOCARDIOGRAM REPORT   Patient Name:   Jenna Glover Date of Exam: 04/07/2021 Medical Rec #:  [id number]   Height:       63.0 in Accession #:    [id number]  Weight:  130.0 lb Date of Birth:  Nov 08, 1995    BSA:          1.610 m Patient Age:    25 years    BP:            114/75 mmHg Patient Gender: F           HR:           63 bpm. Exam Location:  Inpatient Procedure: 2D Echo, Color Doppler and Cardiac Doppler Indications:    Syncope  History:        Patient has no prior history of Echocardiogram examinations.  Sonographer:    Cleatis Polka Referring Phys: [id number] PRIYANKA O YADAV IMPRESSIONS  1. Left ventricular ejection fraction, by estimation, is 55 to 60%. The left ventricle has normal function. The left ventricle has no regional wall motion abnormalities. Left ventricular diastolic parameters were normal.  2. Right ventricular systolic function is normal. The right ventricular size is normal. Tricuspid regurgitation signal is inadequate for assessing PA pressure.  3. The mitral valve is normal in structure. No evidence of mitral valve regurgitation. No evidence of mitral stenosis.  4. The aortic valve is normal in structure. Aortic valve regurgitation is not visualized. No aortic stenosis is present.  5. The inferior vena cava is normal in size with greater than 50% respiratory variability, suggesting right atrial pressure of 3 mmHg. FINDINGS  Left Ventricle: Left ventricular ejection fraction, by estimation, is 55 to 60%. The left ventricle has normal function. The left ventricle has no regional wall motion abnormalities. The left ventricular internal cavity size was normal in size. There is  no left ventricular hypertrophy. Left ventricular diastolic parameters were normal. Normal left ventricular filling pressure. Right Ventricle: The right ventricular size is normal. No increase in right ventricular wall thickness. Right ventricular systolic function is normal. Tricuspid regurgitation signal is inadequate for assessing PA pressure. Left Atrium: Left atrial size was normal in size. Right Atrium: Right atrial size was normal in size. Pericardium: There is no evidence of pericardial effusion. Mitral Valve: The mitral valve is normal in structure. No evidence of mitral valve  regurgitation. No evidence of mitral valve stenosis. Tricuspid Valve: The tricuspid valve is normal in structure. Tricuspid valve regurgitation is not demonstrated. No evidence of tricuspid stenosis. Aortic Valve: The aortic valve is normal in structure. Aortic valve regurgitation is not visualized. No aortic stenosis is present. Aortic valve peak gradient measures 5.2 mmHg. Pulmonic Valve: The pulmonic valve was normal in structure. Pulmonic valve regurgitation is not visualized. No evidence of pulmonic stenosis. Aorta: The aortic root is normal in size and structure. Venous: The inferior vena cava is normal in size with greater than 50% respiratory variability, suggesting right atrial pressure of 3 mmHg. IAS/Shunts: No atrial level shunt detected by color flow Doppler.  LEFT VENTRICLE PLAX 2D LVIDd:         4.10 cm     Diastology LVIDs:         2.60 cm     LV e' medial:    10.70 cm/s LV PW:         0.70 cm     LV E/e' medial:  6.2 LV IVS:        0.70 cm     LV e' lateral:   16.10 cm/s LVOT diam:     1.80 cm     LV E/e' lateral: 4.1 LV SV:         45 LV SV Index:  28 LVOT Area:     2.54 cm  LV Volumes (MOD) LV vol d, MOD A2C: 86.7 ml LV vol d, MOD A4C: 84.4 ml LV vol s, MOD A2C: 38.3 ml LV vol s, MOD A4C: 36.6 ml LV SV MOD A2C:     48.4 ml LV SV MOD A4C:     84.4 ml LV SV MOD BP:      48.2 ml RIGHT VENTRICLE             IVC RV Basal diam:  3.00 cm     IVC diam: 1.30 cm RV S prime:     11.50 cm/s TAPSE (M-mode): 1.6 cm LEFT ATRIUM             Index        RIGHT ATRIUM          Index LA diam:        2.40 cm 1.49 cm/m   RA Area:     9.25 cm LA Vol (A2C):   17.6 ml 10.93 ml/m  RA Volume:   17.00 ml 10.56 ml/m LA Vol (A4C):   17.8 ml 11.05 ml/m LA Biplane Vol: 18.5 ml 11.49 ml/m  AORTIC VALVE AV Area (Vmax): 2.25 cm AV Vmax:        113.50 cm/s AV Peak Grad:   5.2 mmHg LVOT Vmax:      100.25 cm/s LVOT Vmean:     72.200 cm/s LVOT VTI:       0.178 m  AORTA Ao Root diam: 2.40 cm Ao Asc diam:  2.30 cm MITRAL VALVE  MV Area (PHT): 4.15 cm    SHUNTS MV Decel Time: 183 msec    Systemic VTI:  0.18 m MV E velocity: 66.30 cm/s  Systemic Diam: 1.80 cm MV A velocity: 38.60 cm/s MV E/A ratio:  1.72 Gloris Manchester Turner MD Electronically signed by Armanda Magic MD Signature Date/Time: 04/07/2021/9:53:14 AM    Final         Scheduled Meds:  sodium chloride flush  3 mL Intravenous Q12H   Continuous Infusions:  sodium chloride       LOS: 0 days    Time spent:   Zannie Cove, MD [facility]   04/07/2021, 10:41 AM

## 2021-04-07 NOTE — Plan of Care (Signed)
  Problem: Education: Goal: Knowledge of General Education information will improve Description: Including pain rating scale, medication(s)/side effects and non-pharmacologic comfort measures Outcome: Adequate for Discharge   

## 2021-04-07 NOTE — Evaluation (Addendum)
Physical Therapy Evaluation/ Discharge Patient Details Name: Jenna Glover MRN: [id number] DOB: [birth year] Today's Date: 04/07/2021  History of Present Illness  25 yo admitted 10/25 with repeated syncopal episodes. EEG WNL 10/26. No pertinent PMHx  Clinical Impression  Pt pleasant and reports multiple falls in the last 6 months. Pt reports at times she is walking around for several minutes prior to knees buckling and falling. Pt also demstrates presyncope with neck extension over 2 trials. Pt with facial numbness and presyncope with vertebral artery test when rotated and extended to left. Pt with intact balance with Berg test and noted left hip flexion weakness which pt reports she was unaware of previously. Pt states she is relatively sedentary playing video games but does go outside with 4yo daughter. Pt currently able to demonstrate independence with basic transfers, gait and balance without further need for therapy intervention. Encouraged mobility acutely. Pt aware and agreeable, will sign off.   Supine 130/44 (71) Sitting 135/102 (113 ) Standing 125/98 (108), HR 89 Standing 3 minutes 135/90 (103) After presyncope with neck extension 134/81 (91)     Recommendations for follow up therapy are one component of a multi-disciplinary discharge planning process, led by the attending physician.  Recommendations may be updated based on patient status, additional functional criteria and insurance authorization.  Follow Up Recommendations No PT follow up    Assistance Recommended at Discharge None  Functional Status Assessment Patient has not had a recent decline in their functional status  Equipment Recommendations  None recommended by PT    Recommendations for Other Services       Precautions / Restrictions Precautions Precautions: Fall      Mobility  Bed Mobility Overal bed mobility: Independent                  Transfers Overall transfer level: Independent                       Ambulation/Gait Ambulation/Gait assistance: Supervision Gait Distance (Feet): 400 Feet Assistive device: None Gait Pattern/deviations: WFL(Within Functional Limits)   Gait velocity interpretation: >2.62 ft/sec, indicative of community ambulatory General Gait Details: pt with periodic left foot drop but inconsistent  Stairs Stairs: Yes Stairs assistance: Modified independent (Device/Increase time) Stair Management: One rail Right;Step to pattern;Forwards Number of Stairs: 3 General stair comments: pt with slight catch of left toe on ascent during 1 step but able to correct and prevent LOB  Wheelchair Mobility    Modified Rankin (Stroke Patients Only)       Balance Overall balance assessment: History of Falls                               Standardized Balance Assessment Standardized Balance Assessment : Berg Balance Test Berg Balance Test Sit to Stand: Able to stand without using hands and stabilize independently Standing Unsupported: Able to stand safely 2 minutes Sitting with Back Unsupported but Feet Supported on Floor or Stool: Able to sit safely and securely 2 minutes Stand to Sit: Sits safely with minimal use of hands Transfers: Able to transfer safely, minor use of hands Standing Unsupported with Eyes Closed: Able to stand 10 seconds safely Standing Ubsupported with Feet Together: Able to place feet together independently and stand 1 minute safely From Standing, Reach Forward with Outstretched Arm: Can reach confidently >25 cm (10") From Standing Position, Pick up Object from Floor: Able to pick up shoe safely and  easily From Standing Position, Turn to Look Behind Over each Shoulder: Looks behind from both sides and weight shifts well Turn 360 Degrees: Able to turn 360 degrees safely in 4 seconds or less Standing Unsupported, Alternately Place Feet on Step/Stool: Able to stand independently and safely and complete 8 steps in 20  seconds Standing Unsupported, One Foot in Front: Able to place foot tandem independently and hold 30 seconds Standing on One Leg: Able to lift leg independently and hold > 10 seconds Total Score: 56         Pertinent Vitals/Pain Pain Assessment: No/denies pain    Home Living Family/patient expects to be discharged to:: Private residence Living Arrangements: Spouse/significant other Available Help at Discharge: Family;Available 24 hours/day Type of Home: House Home Access: Stairs to enter Entrance Stairs-Rails: Doctor, general practice of Steps: 3   Home Layout: One level Home Equipment: None      Prior Function Prior Level of Function : Independent/Modified Independent;History of Falls (last six months)             Mobility Comments: independent with frequent falls in the last 6 months. 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        Extremity/Trunk Assessment   Upper Extremity Assessment Upper Extremity Assessment: Overall WFL for tasks assessed    Lower Extremity Assessment Lower Extremity Assessment: RLE deficits/detail RLE Deficits / Details: 3/5 hip flexion, knee flexion/extension 5/5 dorsiflexion 5/5    Cervical / Trunk Assessment Cervical / Trunk Assessment: Normal  Communication   Communication: No difficulties  Cognition Arousal/Alertness: Awake/alert Behavior During Therapy: WFL for tasks assessed/performed Overall Cognitive Status: Within Functional Limits for tasks assessed                                          General Comments      Exercises     Assessment/Plan    PT Assessment Patient does not need any further PT services  PT Problem List         PT Treatment Interventions      PT Goals (Current goals can be found in the Care Plan section)  Acute Rehab PT Goals PT Goal Formulation: All assessment and education complete, DC therapy    Frequency     Barriers to discharge         Co-evaluation               AM-PAC PT "6 Clicks" Mobility  Outcome Measure Help needed turning from your back to your side while in a flat bed without using bedrails?: None Help needed moving from lying on your back to sitting on the side of a flat bed without using bedrails?: None Help needed moving to and from a bed to a chair (including a wheelchair)?: None Help needed standing up from a chair using your arms (e.g., wheelchair or bedside chair)?: None Help needed to walk in hospital room?: None Help needed climbing 3-5 steps with a railing? : None 6 Click Score: 24    End of Session Equipment Utilized During Treatment: Gait belt Activity Tolerance: Patient tolerated treatment well Patient left: in chair;with call bell/phone within reach;with chair alarm set Nurse Communication: Mobility status PT Visit Diagnosis: Other abnormalities of gait and mobility (R26.89)    Time: 4599-7741 PT Time Calculation (min) (ACUTE ONLY): 34 min   Charges:  PT Evaluation $PT Eval Moderate Complexity: 1 Mod          Mariaisabel Bodiford P, PT Acute Rehabilitation Services Pager: [phone] Office: [phone]   Enedina Finner Murry Diaz 04/07/2021, 11:37 AM

## 2021-04-07 NOTE — Discharge Summary (Signed)
Physician Discharge Summary  Jenna Glover RUE:[id number] DOB: [birth year] DOA: 04/05/2021  PCP: Pcp, No  Admit date: 04/05/2021 Discharge date: 04/07/2021  Time spent:  Recommendations for Outpatient Follow-up:  Outpatient neurology in 1 month, referral sent Do not drive or operate machinery or swim for 6 months or until cleared by physician   Discharge Diagnoses:  Principal Problem:   Syncope Near syncope Headaches  Discharge Condition: Stable  Diet recommendation: Regular  Filed Weights   04/05/21 1128  Weight: 59 kg    History of present illness:  Jenna Glover is a 25 y.o. female with no significant past medical history who presented to ED after multiple episodes of syncope with loss of awareness. Patient states these episodes started about 6 months ago. She was walking over to pick up her 80 year old daughter and suddenly fell down without loss of consciousness, was able to "catch her fall" and didnt hit her head. She didn't think much of it but gradually the episodes started happening repeatedly and she had 4 episode yesterday. Reports feeling dizzy and blurry vision in both eyes prior to the episode followed by falling down, sometimes she has loss of awareness for 3-4 minutes. Also reports some twitching of extremities with these episodes, after the fall. Denies B/B incontinence, chest pain, SOB, palpitations. All the episodes happen when patient is standing up or walking, can be triggered by exercising or extending her neck and abducting her arms behind the neck.   Patient also states she has bifrontal headache which at times is predominantly left sided headache, associated with nausea, photophobia, associated with numbness of right side of face, on and off for last 4 months. Patient takes aleve and tylenol which helps some almost everyday.  denies h/o epilepsy in family, migraine, heart disease -Denies any new medications, smokes a small amount of cannabis daily, denies  any other illicit drugs or alcohol -In the ED she had a CT head and CTA head and neck which were unremarkable, labs were unrevealing as well    Hospital Course:   Recurrent syncope versus near syncope Transient alteration of awareness Chronic migraine with aura -EKG noted sinus rhythm, no arrhythmias noted on telemetry, 2D echo was normal -CT head, CTA head and neck, MRI brain and C-spine were unremarkable -Orthostatics were negative, 2D echo was unremarkable as well -EEG was negative for epileptiform activity -Seen by neurology in consultation, her episodes were suspected to be multifactorial with a component complicated migraine, 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(although no stenosis noted on CTA) , with 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 which 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 as all her episodes were unwitnessed and without any accompanying injury, neurology recommended low-dose nortriptyline nightly -Ambulating independently in the room, and with physical therapy -Outpatient neurology referral sent,  -Advised not to drive or operate machinery, refrain from swimming for 6 months , until cleared by MD  Procedures: EEG, 2D echo  Consultations: Neurology  Discharge Exam: Vitals:   04/07/21 0800 04/07/21 1043  BP:  (!) 130/44  Pulse:  67  Resp: 16 13  Temp:  98 F (36.7 C)  SpO2:  99%   Gen: Awake, Alert, Oriented X 3,  HEENT: no JVD Lungs: Good air movement bilaterally, CTAB CVS: S1S2/RRR Abd: soft, Non tender, non distended, BS present Extremities: No edema Skin: no new rashes on exposed skin   Discharge Instructions   Discharge Instructions     Ambulatory referral to Neurology   Complete by: As directed    An appointment is requested  in approximately: 4 weeks   Diet general   Complete by: As directed    Discharge instructions   Complete by: As directed    Do not drive, operate machinery or swim for 6 months or until cleared by an outpatient neurologist   Increase  activity slowly   Complete by: As directed       Allergies as of 04/07/2021       Reactions   Tylenol [acetaminophen] Nausea And Vomiting        Medication List     TAKE these medications    CALCIUM PO Take 1 tablet by mouth daily.   multivitamin tablet Take 1 tablet by mouth daily.   nortriptyline 10 MG capsule Commonly known as: PAMELOR Take 1 capsule (10 mg total) by mouth at bedtime.   VITAMIN D PO Take 1 tablet by mouth daily.       Allergies  Allergen Reactions   Tylenol [Acetaminophen] Nausea And Vomiting      The results of significant diagnostics from this hospitalization (including imaging, microbiology, ancillary and laboratory) are listed below for reference.    Significant Diagnostic Studies: CT Angio Head W or Wo Contrast  Result Date: 04/05/2021 CLINICAL DATA:  Syncope, recurrent Per neurology, CTA head and neck both with normal positioning and with maximal neck extension as this has provoked patient's syncope. EXAM: CT ANGIOGRAPHY HEAD AND NECK TECHNIQUE: Multidetector CT imaging of the head and neck was performed using the standard protocol during bolus administration of intravenous contrast. Multiplanar CT image reconstructions and MIPs were obtained to evaluate the vascular anatomy. Carotid stenosis measurements (when applicable) are obtained utilizing NASCET criteria, using the distal internal carotid diameter as the denominator. CTA of the neck was performed in neutral position and extension due to the patient's worsening syncope with extension. CONTRAST:  38mL OMNIPAQUE IOHEXOL 350 MG/ML SOLN, 8mL OMNIPAQUE IOHEXOL 350 MG/ML SOLN COMPARISON:  None. FINDINGS: CT HEAD FINDINGS Brain: No evidence of acute large vascular territory infarct. Infarction, hemorrhage, hydrocephalus, extra-axial collection or mass lesion/mass effect. Vascular: See below. Skull: No acute fracture. Sinuses: Visualized sinuses are clear. Orbits: No acute findings in the  visualized orbits. Review of the MIP images confirms the above findings CTA NECK FINDINGS Aortic arch: Great vessel origins are patent. Right carotid system: No evidence of dissection, stenosis (50% or greater) or occlusion on either neutral or extension imaging. The left. Left carotid system: No evidence of dissection, stenosis (50% or greater) or occlusion on either neutral or extension imaging. Vertebral arteries: Codominant. No evidence of dissection, stenosis (50% or greater) or occlusion. The left vertebral artery enters the left transverse foramen high at the C3 level. Skeleton: Mild degenerative change. Other neck: No evidence of acute abnormality. Reversal of the normal cervical lordosis. Upper chest: Visualized lung apices are clear. Review of the MIP images confirms the above findings CTA HEAD FINDINGS Anterior circulation: Bilateral intracranial ICAs, MCAs, and ACAs are patent without proximal hemodynamically significant stenosis. Limited distal vessel evaluation due to venous contamination. Posterior circulation: Bilateral intradural vertebral arteries, basilar artery, and posterior cerebral arteries are patent without proximal hemodynamically significant stenosis. Venous sinuses: As permitted by contrast timing, patent. Review of the MIP images confirms the above findings IMPRESSION: CT head No evidence of acute intracranial abnormality. CTA head: No large vessel occlusion or proximal hemodynamically significant stenosis. Limited evaluation due to venous contamination. CTA neck: 1. No evidence of significant (greater than 50%) stenosis with neutral and extension positioning. 2. The left vertebral artery enters  the left transverse foramen high at the C3 level. Electronically Signed   By: Feliberto Harts M.D.   On: 04/05/2021 20:14   DG Chest 2 View  Result Date: 04/05/2021 CLINICAL DATA:  Recurrent syncope EXAM: CHEST - 2 VIEW COMPARISON:  None. FINDINGS: The heart size and mediastinal contours  are within normal limits. Both lungs are clear. The visualized skeletal structures are unremarkable. IMPRESSION: Normal study. Electronically Signed   By: Charlett Nose M.D.   On: 04/05/2021 18:47   CT ANGIO NECK W OR WO CONTRAST  Result Date: 04/05/2021 CLINICAL DATA:  Syncope, recurrent Per neurology, CTA head and neck both with normal positioning and with maximal neck extension as this has provoked patient's syncope. EXAM: CT ANGIOGRAPHY HEAD AND NECK TECHNIQUE: Multidetector CT imaging of the head and neck was performed using the standard protocol during bolus administration of intravenous contrast. Multiplanar CT image reconstructions and MIPs were obtained to evaluate the vascular anatomy. Carotid stenosis measurements (when applicable) are obtained utilizing NASCET criteria, using the distal internal carotid diameter as the denominator. CTA of the neck was performed in neutral position and extension due to the patient's worsening syncope with extension. CONTRAST:  60mL OMNIPAQUE IOHEXOL 350 MG/ML SOLN, 60mL OMNIPAQUE IOHEXOL 350 MG/ML SOLN COMPARISON:  None. FINDINGS: CT HEAD FINDINGS Brain: No evidence of acute large vascular territory infarct. Infarction, hemorrhage, hydrocephalus, extra-axial collection or mass lesion/mass effect. Vascular: See below. Skull: No acute fracture. Sinuses: Visualized sinuses are clear. Orbits: No acute findings in the visualized orbits. Review of the MIP images confirms the above findings CTA NECK FINDINGS Aortic arch: Great vessel origins are patent. Right carotid system: No evidence of dissection, stenosis (50% or greater) or occlusion on either neutral or extension imaging. The left. Left carotid system: No evidence of dissection, stenosis (50% or greater) or occlusion on either neutral or extension imaging. Vertebral arteries: Codominant. No evidence of dissection, stenosis (50% or greater) or occlusion. The left vertebral artery enters the left transverse foramen high  at the C3 level. Skeleton: Mild degenerative change. Other neck: No evidence of acute abnormality. Reversal of the normal cervical lordosis. Upper chest: Visualized lung apices are clear. Review of the MIP images confirms the above findings CTA HEAD FINDINGS Anterior circulation: Bilateral intracranial ICAs, MCAs, and ACAs are patent without proximal hemodynamically significant stenosis. Limited distal vessel evaluation due to venous contamination. Posterior circulation: Bilateral intradural vertebral arteries, basilar artery, and posterior cerebral arteries are patent without proximal hemodynamically significant stenosis. Venous sinuses: As permitted by contrast timing, patent. Review of the MIP images confirms the above findings IMPRESSION: CT head No evidence of acute intracranial abnormality. CTA head: No large vessel occlusion or proximal hemodynamically significant stenosis. Limited evaluation due to venous contamination. CTA neck: 1. No evidence of significant (greater than 50%) stenosis with neutral and extension positioning. 2. The left vertebral artery enters the left transverse foramen high at the C3 level. Electronically Signed   By: Feliberto Harts M.D.   On: 04/05/2021 20:14   CT Angio Neck W and/or Wo Contrast  Result Date: 04/05/2021 CLINICAL DATA:  Syncope, recurrent Per neurology, CTA head and neck both with normal positioning and with maximal neck extension as this has provoked patient's syncope. EXAM: CT ANGIOGRAPHY HEAD AND NECK TECHNIQUE: Multidetector CT imaging of the head and neck was performed using the standard protocol during bolus administration of intravenous contrast. Multiplanar CT image reconstructions and MIPs were obtained to evaluate the vascular anatomy. Carotid stenosis measurements (when applicable) are obtained  utilizing NASCET criteria, using the distal internal carotid diameter as the denominator. CTA of the neck was performed in neutral position and extension due to  the patient's worsening syncope with extension. CONTRAST:  60mL OMNIPAQUE IOHEXOL 350 MG/ML SOLN, 60mL OMNIPAQUE IOHEXOL 350 MG/ML SOLN COMPARISON:  None. FINDINGS: CT HEAD FINDINGS Brain: No evidence of acute large vascular territory infarct. Infarction, hemorrhage, hydrocephalus, extra-axial collection or mass lesion/mass effect. Vascular: See below. Skull: No acute fracture. Sinuses: Visualized sinuses are clear. Orbits: No acute findings in the visualized orbits. Review of the MIP images confirms the above findings CTA NECK FINDINGS Aortic arch: Great vessel origins are patent. Right carotid system: No evidence of dissection, stenosis (50% or greater) or occlusion on either neutral or extension imaging. The left. Left carotid system: No evidence of dissection, stenosis (50% or greater) or occlusion on either neutral or extension imaging. Vertebral arteries: Codominant. No evidence of dissection, stenosis (50% or greater) or occlusion. The left vertebral artery enters the left transverse foramen high at the C3 level. Skeleton: Mild degenerative change. Other neck: No evidence of acute abnormality. Reversal of the normal cervical lordosis. Upper chest: Visualized lung apices are clear. Review of the MIP images confirms the above findings CTA HEAD FINDINGS Anterior circulation: Bilateral intracranial ICAs, MCAs, and ACAs are patent without proximal hemodynamically significant stenosis. Limited distal vessel evaluation due to venous contamination. Posterior circulation: Bilateral intradural vertebral arteries, basilar artery, and posterior cerebral arteries are patent without proximal hemodynamically significant stenosis. Venous sinuses: As permitted by contrast timing, patent. Review of the MIP images confirms the above findings IMPRESSION: CT head No evidence of acute intracranial abnormality. CTA head: No large vessel occlusion or proximal hemodynamically significant stenosis. Limited evaluation due to venous  contamination. CTA neck: 1. No evidence of significant (greater than 50%) stenosis with neutral and extension positioning. 2. The left vertebral artery enters the left transverse foramen high at the C3 level. Electronically Signed   By: Feliberto Harts M.D.   On: 04/05/2021 20:14   MR Brain W and Wo Contrast  Result Date: 04/06/2021 CLINICAL DATA:  Syncope, recurrent. Numbness and tingling in extremities. EXAM: MRI HEAD WITHOUT AND WITH CONTRAST TECHNIQUE: Multiplanar, multiecho pulse sequences of the brain and surrounding structures were obtained without and with intravenous contrast. CONTRAST:  6mL GADAVIST GADOBUTROL 1 MMOL/ML IV SOLN COMPARISON:  CT angiogram head/neck 04/05/2021. FINDINGS: Brain: Mild intermittent motion degradation. No cortical encephalomalacia is identified. No significant cerebral white matter disease. There is no acute infarct. No evidence of an intracranial mass. No chronic intracranial blood products. No extra-axial fluid collection. No midline shift. No pathologic intracranial enhancement identified. Vascular: Maintained flow voids within the proximal large arterial vessels. Skull and upper cervical spine: No focal suspicious marrow lesion. Sinuses/Orbits: Visualized orbits show no acute finding. Minimal mucosal thickening within the anterior ethmoid sinuses bilaterally. Other: Trace fluid within the bilateral mastoid air cells. IMPRESSION: Mildly motion degraded exam. Unremarkable MRI appearance of the brain. No evidence of acute intracranial abnormality. Minimal bilateral ethmoid sinus mucosal thickening. Trace fluid within the bilateral mastoid air cells. Electronically Signed   By: Jackey Loge D.O.   On: 04/06/2021 16:23   MR Cervical Spine W or Wo Contrast  Result Date: 04/06/2021 CLINICAL DATA:  Numbness or tingling, paresthesia. Syncope with neck positioning, concern for Chiari versus cervical abnormality leading to syncopal episodes. EXAM: MRI CERVICAL SPINE WITHOUT  AND WITH CONTRAST TECHNIQUE: Multiplanar and multiecho pulse sequences of the cervical spine, to include the craniocervical junction and  cervicothoracic junction, were obtained without and with intravenous contrast. CONTRAST:  74mL GADAVIST GADOBUTROL 1 MMOL/ML IV SOLN COMPARISON:  CT angiogram head/neck 04/05/2021. FINDINGS: Mildly motion degraded exam. Alignment: Straightening of the expected cervical lordosis. No significant spondylolisthesis. Vertebrae: Vertebral body height is maintained. Degenerative endplate irregularity at C5-C6. Associated mild degenerative endplate edema and enhancement at this level. Elsewhere, no significant marrow edema or focal suspicious osseous lesion is identified. Cord: Within the limitations of motion degradation, no signal abnormality is identified within the cervical spinal cord. No abnormal spinal cord enhancement. Posterior Fossa, vertebral arteries, paraspinal tissues: Posterior fossa better assessed on concurrently performed brain MRI. Flow voids preserved within the imaged cervical vertebral arteries. Paraspinal soft tissues unremarkable. Disc levels: Mild multilevel disc degeneration, greatest at C5-C6. C2-C3: No significant disc herniation or stenosis. C3-C4: No significant disc herniation or stenosis. C4-C5: Tiny left center disc protrusion. Uncovertebral hypertrophy on the right. No significant spinal canal stenosis or neural foraminal narrowing. C5-C6: Disc bulge with bilateral uncovertebral hypertrophy. Mild relative spinal canal narrowing (without spinal cord mass effect). No significant foraminal stenosis. C6-C7: Shallow broad-based left center disc protrusion. No significant spinal canal or foraminal stenosis. C7-T1: Shallow disc bulge. No significant spinal canal or foraminal stenosis. IMPRESSION: Mildly motion degraded exam. No appreciable signal abnormality within the cervical spinal cord. No abnormal spinal cord enhancement. Cervical spondylosis, as outlined and  with findings most notably as follows. At C5-C6, there is mild disc degeneration with degenerative endplate irregularity and mild degenerative endplate edema/enhancement. Disc bulge with bilateral uncovertebral hypertrophy. Mild relative spinal canal narrowing (without spinal cord mass effect). No significant foraminal stenosis. No significant spinal canal or foraminal stenosis at the remaining levels. Straightening of the expected cervical lordosis. Electronically Signed   By: Jackey Loge D.O.   On: 04/06/2021 16:15   EEG adult  Result Date: 04/07/2021 Charlsie Quest, MD     04/07/2021  8:47 AM Patient Name: Vennessa Affinito MRN: [id number] Epilepsy Attending: Charlsie Quest Referring Physician/Provider: Dr Zannie Cove Date: 04/06/2021 Duration: 24.11 mins Patient history:  25 year old female with repeated episodes of passing out. Level of alertness: Awake, asleep AEDs during EEG study: None Technical aspects: This EEG study was done with scalp electrodes positioned according to the 10-20 International system of electrode placement. Electrical activity was acquired at a sampling rate of 500Hz  and reviewed with a high frequency filter of 70Hz  and a low frequency filter of 1Hz . EEG data were recorded continuously and digitally stored. Description: The posterior dominant rhythm consists of 10 Hz activity of moderate voltage (25-35 uV) seen predominantly in posterior head regions, symmetric and reactive to eye opening and eye closing. Sleep was characterized by vertex waves, sleep spindles (12 to 14 Hz), maximal frontocentral region. Hyperventilation and photic stimulation were not performed.   IMPRESSION: This study is within normal limits. No seizures or epileptiform discharges were seen throughout the recording.   ECHOCARDIOGRAM COMPLETE  Result Date: 04/07/2021    ECHOCARDIOGRAM REPORT   Patient Name:   SEREEN SCHAFF Date of Exam: 04/07/2021 Medical Rec #:  04/09/2021   Height:       63.0  in Accession #:    Selinda Michaels  Weight:       130.0 lb Date of Birth:  [birth year]    BSA:          1.610 m Patient Age:    25 years    BP:           114/75 mmHg  Patient Gender: F           HR:           63 bpm. Exam Location:  Inpatient Procedure: 2D Echo, Color Doppler and Cardiac Doppler Indications:    Syncope  History:        Patient has no prior history of Echocardiogram examinations.  Sonographer:    Cleatis Polka Referring Phys: [id number] PRIYANKA O YADAV IMPRESSIONS  1. Left ventricular ejection fraction, by estimation, is 55 to 60%. The left ventricle has normal function. The left ventricle has no regional wall motion abnormalities. Left ventricular diastolic parameters were normal.  2. Right ventricular systolic function is normal. The right ventricular size is normal. Tricuspid regurgitation signal is inadequate for assessing PA pressure.  3. The mitral valve is normal in structure. No evidence of mitral valve regurgitation. No evidence of mitral stenosis.  4. The aortic valve is normal in structure. Aortic valve regurgitation is not visualized. No aortic stenosis is present.  5. The inferior vena cava is normal in size with greater than 50% respiratory variability, suggesting right atrial pressure of 3 mmHg. FINDINGS  Left Ventricle: Left ventricular ejection fraction, by estimation, is 55 to 60%. The left ventricle has normal function. The left ventricle has no regional wall motion abnormalities. The left ventricular internal cavity size was normal in size. There is  no left ventricular hypertrophy. Left ventricular diastolic parameters were normal. Normal left ventricular filling pressure. Right Ventricle: The right ventricular size is normal. No increase in right ventricular wall thickness. Right ventricular systolic function is normal. Tricuspid regurgitation signal is inadequate for assessing PA pressure. Left Atrium: Left atrial size was normal in size. Right Atrium: Right atrial size was normal in  size. Pericardium: There is no evidence of pericardial effusion. Mitral Valve: The mitral valve is normal in structure. No evidence of mitral valve regurgitation. No evidence of mitral valve stenosis. Tricuspid Valve: The tricuspid valve is normal in structure. Tricuspid valve regurgitation is not demonstrated. No evidence of tricuspid stenosis. Aortic Valve: The aortic valve is normal in structure. Aortic valve regurgitation is not visualized. No aortic stenosis is present. Aortic valve peak gradient measures 5.2 mmHg. Pulmonic Valve: The pulmonic valve was normal in structure. Pulmonic valve regurgitation is not visualized. No evidence of pulmonic stenosis. Aorta: The aortic root is normal in size and structure. Venous: The inferior vena cava is normal in size with greater than 50% respiratory variability, suggesting right atrial pressure of 3 mmHg. IAS/Shunts: No atrial level shunt detected by color flow Doppler.  LEFT VENTRICLE PLAX 2D LVIDd:         4.10 cm     Diastology LVIDs:         2.60 cm     LV e' medial:    10.70 cm/s LV PW:         0.70 cm     LV E/e' medial:  6.2 LV IVS:        0.70 cm     LV e' lateral:   16.10 cm/s LVOT diam:     1.80 cm     LV E/e' lateral: 4.1 LV SV:         45 LV SV Index:   28 LVOT Area:     2.54 cm  LV Volumes (MOD) LV vol d, MOD A2C: 86.7 ml LV vol d, MOD A4C: 84.4 ml LV vol s, MOD A2C: 38.3 ml LV vol s, MOD A4C: 36.6 ml LV SV MOD  A2C:     48.4 ml LV SV MOD A4C:     84.4 ml LV SV MOD BP:      48.2 ml RIGHT VENTRICLE             IVC RV Basal diam:  3.00 cm     IVC diam: 1.30 cm RV S prime:     11.50 cm/s TAPSE (M-mode): 1.6 cm LEFT ATRIUM             Index        RIGHT ATRIUM          Index LA diam:        2.40 cm 1.49 cm/m   RA Area:     9.25 cm LA Vol (A2C):   17.6 ml 10.93 ml/m  RA Volume:   17.00 ml 10.56 ml/m LA Vol (A4C):   17.8 ml 11.05 ml/m LA Biplane Vol: 18.5 ml 11.49 ml/m  AORTIC VALVE AV Area (Vmax): 2.25 cm AV Vmax:        113.50 cm/s AV Peak Grad:   5.2  mmHg LVOT Vmax:      100.25 cm/s LVOT Vmean:     72.200 cm/s LVOT VTI:       0.178 m  AORTA Ao Root diam: 2.40 cm Ao Asc diam:  2.30 cm MITRAL VALVE MV Area (PHT): 4.15 cm    SHUNTS MV Decel Time: 183 msec    Systemic VTI:  0.18 m MV E velocity: 66.30 cm/s  Systemic Diam: 1.80 cm MV A velocity: 38.60 cm/s MV E/A ratio:  1.72 Armanda Magic MD Electronically signed by Armanda Magic MD Signature Date/Time: 04/07/2021/9:53:14 AM    Final     Microbiology: Recent Results (from the past 240 hour(s))  Urine Culture     Status: Abnormal   Collection Time: 04/05/21  3:00 PM   Specimen: Urine, Clean Catch  Result Value Ref Range Status   Specimen Description   Final    URINE, CLEAN CATCH Performed at [facility], [address] [place], [place] [address]    Special Requests   Final    NONE Performed at [facility], [address] [place], [place] [address]    Culture 90,000 COLONIES/mL ESCHERICHIA COLI (A)  Final   Report Status 04/07/2021 FINAL  Final   Organism ID, Bacteria ESCHERICHIA COLI (A)  Final      Susceptibility   Escherichia coli - MIC*    AMPICILLIN 4 SENSITIVE Sensitive     CEFAZOLIN <=4 SENSITIVE Sensitive     CEFEPIME <=0.12 SENSITIVE Sensitive     CEFTRIAXONE <=0.25 SENSITIVE Sensitive     CIPROFLOXACIN <=0.25 SENSITIVE Sensitive     GENTAMICIN <=1 SENSITIVE Sensitive     IMIPENEM <=0.25 SENSITIVE Sensitive     NITROFURANTOIN <=16 SENSITIVE Sensitive     TRIMETH/SULFA <=20 SENSITIVE Sensitive     AMPICILLIN/SULBACTAM <=2 SENSITIVE Sensitive     PIP/TAZO <=4 SENSITIVE Sensitive     * 90,000 COLONIES/mL ESCHERICHIA COLI  Resp Panel by RT-PCR (Flu A&B, Covid) Nasopharyngeal Swab     Status: None   Collection Time: 04/05/21  6:47 PM   Specimen: Nasopharyngeal Swab; Nasopharyngeal(NP) swabs in vial transport medium  Result Value Ref Range Status   SARS Coronavirus 2 by RT PCR NEGATIVE NEGATIVE Final    Comment: (NOTE) SARS-CoV-2  target nucleic acids are NOT DETECTED.  The SARS-CoV-2 RNA is generally detectable in upper respiratory specimens during the acute phase of infection. The lowest  concentration of SARS-CoV-2 viral copies this assay can detect is 138 copies/mL. A negative result does not preclude SARS-Cov-2 infection and should not be used as the sole basis for treatment or other patient management decisions. A negative result may occur with  improper specimen collection/handling, submission of specimen other than nasopharyngeal swab, presence of viral mutation(s) within the areas targeted by this assay, and inadequate number of viral copies(<138 copies/mL). A negative result must be combined with clinical observations, patient history, and epidemiological information. The expected result is Negative.  Fact Sheet for Patients:  BloggerCourse.com  Fact Sheet for Healthcare Providers:  SeriousBroker.it  This test is no t yet approved or cleared by the [place] FDA and  has been authorized for detection and/or diagnosis of SARS-CoV-2 by FDA under an Emergency Use Authorization (EUA). This EUA will remain  in effect (meaning this test can be used) for the duration of the COVID-19 declaration under Section 564(b)(1) of the Act, 21 U.S.C.section 360bbb-3(b)(1), unless the authorization is terminated  or revoked sooner.       Influenza A by PCR NEGATIVE NEGATIVE Final   Influenza B by PCR NEGATIVE NEGATIVE Final    Comment: (NOTE) The Xpert Xpress SARS-CoV-2/FLU/RSV plus assay is intended as an aid in the diagnosis of influenza from Nasopharyngeal swab specimens and should not be used as a sole basis for treatment. Nasal washings and aspirates are unacceptable for Xpert Xpress SARS-CoV-2/FLU/RSV testing.  Fact Sheet for Patients: BloggerCourse.com  Fact Sheet for Healthcare  Providers: SeriousBroker.it  This test is not yet approved or cleared by the [place] FDA and has been authorized for detection and/or diagnosis of SARS-CoV-2 by FDA under an Emergency Use Authorization (EUA). This EUA will remain in effect (meaning this test can be used) for the duration of the COVID-19 declaration under Section 564(b)(1) of the Act, 21 U.S.C. section 360bbb-3(b)(1), unless the authorization is terminated or revoked.  Performed at [facility], [address] [place], [place] [address]      Labs: Basic Metabolic Panel: Recent Labs  Lab 04/05/21 1145 04/06/21 0355  NA 136 140  K 4.1 3.5  CL 106 106  CO2 21* 26  GLUCOSE 87 122*  BUN 10 7  CREATININE 0.64 0.90  CALCIUM 9.1 9.3  MG 2.1  --    Liver Function Tests: Recent Labs  Lab 04/05/21 1145  AST 13*  ALT 9  ALKPHOS 35*  BILITOT 0.4  PROT 7.3  ALBUMIN 4.6   No results for input(s): LIPASE, AMYLASE in the last 168 hours. No results for input(s): AMMONIA in the last 168 hours. CBC: Recent Labs  Lab 04/05/21 1145  WBC 8.8  HGB 14.0  HCT 41.5  MCV 86.8  PLT 244   Cardiac Enzymes: No results for input(s): CKTOTAL, CKMB, CKMBINDEX, TROPONINI in the last 168 hours. BNP: BNP (last 3 results) No results for input(s): BNP in the last 8760 hours.  ProBNP (last 3 results) No results for input(s): PROBNP in the last 8760 hours.  CBG: Recent Labs  Lab 04/05/21 1143  GLUCAP 85       Signed:  Zannie Cove MD.  [facility] 04/07/2021, 3:31 PM

## 2021-04-07 NOTE — Progress Notes (Signed)
Subjective: No further episodes overnight  ROS: negative except above  Examination  Vital signs in last 24 hours: Temp:  [97.5 F (36.4 C)-98.5 F (36.9 C)] 98.5 F (36.9 C) (10/27 1600) Pulse Rate:  [67-82] 67 (10/27 1043) Resp:  [13-19] 18 (10/27 1600) BP: (114-130)/(44-75) 120/71 (10/27 1600) SpO2:  [98 %-99 %] 99 % (10/27 1043)  General: lying in bed, NAD CVS: pulse-normal rate and rhythm RS: breathing comfortably Extremities: normal   Neuro: MS: Alert, oriented, follows commands CN: pupils equal and reactive,  EOMI, face symmetric, tongue midline, normal sensation over face, Motor: 5/5 strength in all 4 extremities Reflexes: 2+ bilaterally over patella, biceps, plantars: flexor Coordination: normal Gait: not tested  Basic Metabolic Panel: Recent Labs  Lab 04/05/21 1145 04/06/21 0355  NA 136 140  K 4.1 3.5  CL 106 106  CO2 21* 26  GLUCOSE 87 122*  BUN 10 7  CREATININE 0.64 0.90  CALCIUM 9.1 9.3  MG 2.1  --     CBC: Recent Labs  Lab 04/05/21 1145  WBC 8.8  HGB 14.0  HCT 41.5  MCV 86.8  PLT 244     Coagulation Studies: Recent Labs    04/05/21 1847  LABPROT 13.0  INR 1.0    Imaging No new brain imaging overnight  ASSESSMENT AND PLAN:  25 year old female with repeated episodes of passing out  Syncope, recurrent Transient alteration of awareness Chronic migraine with aura - EKG showed normal sinus rhythm, normal TTE, normal EEG, negative orthostatic vital signs, no e/o stenosis on CTA in normal and extended neck position - Symptoms happen predominantly when patient is upright and when stretching as well as extending nec and can be associated with headache. Ddx include vertebrobasilar insufficiency vs basilar migraine vs POTS vs less likely BPPV vs cardiogenic   Recommendations: - Will start patient on nortriptyline 10mg  qhs for chronic migraine - I was unable to precipitate the episode with heck extension. However if symptoms persist, can  consider cervical collar and neurosurg consult.  - F/u with neuro in 2-3 months if symptoms persist - Can also consider 30 day holter monitor for arrythmia and tilt table testing as outpatient for POTS - Avoid driving for 6 months/until cleared by a physician as patient has loss of awareness with these episodes   I have spent a total of  40  minutes with the patient reviewing hospital notes,  test results, labs and examining the patient as well as establishing an assessment and plan that was discussed personally with the patient and Dr .  > 50% of time was spent in direct patient care.    Jomarie Longs Epilepsy [facility] For questions after 5pm please refer to AMION to reach the Neurologist on call

## 2021-04-13 ENCOUNTER — Ambulatory Visit: Payer: Medicaid Other | Admitting: Neurology

## 2021-04-13 ENCOUNTER — Encounter: Payer: Self-pay | Admitting: Neurology

## 2021-04-13 VITALS — BP 132/76 | HR 90 | Ht 63.0 in | Wt 152.0 lb

## 2021-04-13 DIAGNOSIS — R55 Syncope and collapse: Secondary | ICD-10-CM | POA: Diagnosis not present

## 2021-04-13 NOTE — Progress Notes (Signed)
[facility]  PATIENT: Jenna Glover DOB: 18-Sep-1995  REFERRING CLINICIAN: Zannie Cove, MD HISTORY FROM: Patient  REASON FOR VISIT: Multiple syncope    HISTORICAL  CHIEF COMPLAINT:  Chief Complaint  Patient presents with   New Patient (Initial Visit)    Rm 12, alone, pt states she is well,  Pt has no PCP    HISTORY OF PRESENT ILLNESS:  This is a 25 year old woman with no past medical history who is presenting after a recent hospital admission for multiple syncope.  Patient reported history of syncopal episode in the past 29-month, reports syncope while walking, described lightheadedness and feeling off before the fall.  She mentions sometimes she can catch her self but other times she will be on the ground, denies any injury, does not report post ictal confusion.  She also reports fainting while stretching her neck.  She said last Monday she was feeling off all day, didn't feel well, that day she had 4 syncopal episodes and decided to present to the ED.  In the ED she was admitted for further work-up.  Patient had a complete work-up including CT head, CT angiogram head and neck and MRI of her brain and neck which were all negative for any acute pathology.  She also had a routine EEG which was negative for any epileptiform discharge.  Orthostatic vitals was done in the hospital and was normal.  She did complain of headache, pressure type headache and was started on nortriptyline 10 mg nightly.  Reported that headaches are still the same, she has not seen any difference, currently she has 2 headache days per week.     Handedness: Left handed   Seizure Type: N/A  Current frequency: N/A  Any injuries from seizures: N/A  Seizure risk factors: No history of seizures, no family history of seizures, had prior concussion   Previous ASMs: None   Currenty ASMs: None   ASMs side effects: N/A  Brain Images: No acute intracranial abnormalities.   Previous EEGs:  Normal, no epileptiform discharges    OTHER MEDICAL CONDITIONS: None   REVIEW OF SYSTEMS: Full 14 system review of systems performed and negative with exception of: as noted in the HPI   ALLERGIES: Allergies  Allergen Reactions   Tylenol [Acetaminophen] Nausea And Vomiting    HOME MEDICATIONS: Outpatient Medications Prior to Visit  Medication Sig Dispense Refill   CALCIUM PO Take 1 tablet by mouth daily.     Multiple Vitamin (MULTIVITAMIN) tablet Take 1 tablet by mouth daily.     nortriptyline (PAMELOR) 10 MG capsule Take 1 capsule (10 mg total) by mouth at bedtime. 30 capsule 0   VITAMIN D PO Take 1 tablet by mouth daily.     No facility-administered medications prior to visit.    PAST MEDICAL HISTORY: Past Medical History:  Diagnosis Date   History of concussion     PAST SURGICAL HISTORY: History reviewed. No pertinent surgical history.  FAMILY HISTORY: History reviewed. No pertinent family history.  SOCIAL HISTORY: Social History   Socioeconomic History   Marital status: Single    Spouse name: Not on file   Number of children: Not on file   Years of education: Not on file   Highest education level: Not on file  Occupational History   Not on file  Tobacco Use   Smoking status: Some Days    Passive exposure: Current   Smokeless tobacco: Never  Vaping Use   Vaping Use: Some days  Substance and  Sexual Activity   Alcohol use: Yes    Comment: occassional   Drug use: Yes    Types: Marijuana   Sexual activity: Not on file  Other Topics Concern   Not on file  Social History Narrative   Not on file   Social Determinants of Health   Financial Resource Strain: Not on file  Food Insecurity: Not on file  Transportation Needs: Not on file  Physical Activity: Not on file  Stress: Not on file  Social Connections: Not on file  Intimate Partner Violence: Not on file     PHYSICAL EXAM  GENERAL EXAM/CONSTITUTIONAL: Vitals:  Vitals:   04/13/21 1314  BP:  132/76  Pulse: 90  Weight: 152 lb (68.9 kg)  Height: 5\' 3"  (1.6 m)   Body mass index is 26.93 kg/m. Wt Readings from Last 3 Encounters:  04/13/21 152 lb (68.9 kg)  04/05/21 130 lb (59 kg)  11/09/20 135 lb (61.2 kg)   Patient is in no distress; well developed, nourished and groomed; neck is supple  EYES: Pupils round and reactive to light, Visual fields full to confrontation, Extraocular movements intacts,  No results found.  MUSCULOSKELETAL: Gait, strength, tone, movements noted in Neurologic exam below  NEUROLOGIC: MENTAL STATUS:  No flowsheet data found. awake, alert, oriented to person, place and time recent and remote memory intact normal attention and concentration language fluent, comprehension intact, naming intact fund of knowledge appropriate  CRANIAL NERVE:  2nd, 3rd, 4th, 6th - pupils equal and reactive to light, visual fields full to confrontation, extraocular muscles intact, no nystagmus 5th - facial sensation symmetric 7th - facial strength symmetric 8th - hearing intact 9th - palate elevates symmetrically, uvula midline 11th - shoulder shrug symmetric 12th - tongue protrusion midline  MOTOR:  normal bulk and tone, full strength in the BUE, BLE  SENSORY:  normal and symmetric to light touch, pinprick, temperature, vibration  COORDINATION:  finger-nose-finger, fine finger movements normal  REFLEXES:  deep tendon reflexes present and symmetric  GAIT/STATION:  normal     DIAGNOSTIC DATA (LABS, IMAGING, TESTING) - I reviewed patient records, labs, notes, testing and imaging myself where available.  Lab Results  Component Value Date   WBC 8.8 04/05/2021   HGB 14.0 04/05/2021   HCT 41.5 04/05/2021   MCV 86.8 04/05/2021   PLT 244 04/05/2021      Component Value Date/Time   NA 140 04/06/2021 0355   K 3.5 04/06/2021 0355   CL 106 04/06/2021 0355   CO2 26 04/06/2021 0355   GLUCOSE 122 (H) 04/06/2021 0355   BUN 7 04/06/2021 0355    CREATININE 0.90 04/06/2021 0355   CALCIUM 9.3 04/06/2021 0355   PROT 7.3 04/05/2021 1145   ALBUMIN 4.6 04/05/2021 1145   AST 13 (L) 04/05/2021 1145   ALT 9 04/05/2021 1145   ALKPHOS 35 (L) 04/05/2021 1145   BILITOT 0.4 04/05/2021 1145   GFRNONAA >60 04/06/2021 0355   No results found for: CHOL, HDL, LDLCALC, LDLDIRECT, TRIG No results found for: HGBA1C No results found for: VITAMINB12 Lab Results  Component Value Date   TSH 4.027 04/05/2021    CT head: No evidence of acute intracranial abnormality.   CTA head: No large vessel occlusion or proximal hemodynamically significant stenosis. Limited evaluation due to venous contamination.   CTA neck: 1. No evidence of significant (greater than 50%) stenosis with neutral and extension positioning. 2. The left vertebral artery enters the left transverse foramen high at the C3 level.  MRI Brain: Unremarkable MRI appearance of the brain. No evidence of acute intracranial abnormality.  MRI C spine:  Mildly motion degraded exam. No appreciable signal abnormality within the cervical spinal cord. No abnormal spinal cord enhancement. Cervical spondylosis, as outlined and with findings most notably as follows. At C5-C6, there is mild disc degeneration with degenerative endplate irregularity and mild degenerative endplate edema/enhancement. Disc bulge with bilateral uncovertebral hypertrophy. Mild relative spinal canal narrowing (without spinal cord mass effect). No significant foraminal stenosis. No significant spinal canal or foraminal stenosis at the remaining levels.   EEG: This study is within normal limits. No seizures or epileptiform discharges were seen throughout the recording    I personally reviewed brain Images and previous EEG reports.   ASSESSMENT AND PLAN  25 y.o. year old female  with no reported past medical history who is presenting with multiple syncopal episodes in the past 6 months.  Patient said last Tuesday she had a  total of 4 syncopal episodes, presented to the ED and was admitted for further work-up.  Work-up including CT head, CTA head and neck, MRI brain and MRI cervical spine were done and were all normal.  She even had a EEG which was negative for any epileptiform discharge and orthostatic vitals which was also normal.  At this point no neurological etiology of her syncopal episode was found.  I have advised patient to remain well-hydrated, and if these symptoms continue then to follow-up with her primary care doctor and cardiologist.  Currently she does not have a primary care doctor but is actively looking for one. In terms of her headaches, she was also started on nortriptyline, stated currently no change in the headache frequency but she is compliant with her medication.  I advised her to continue the same medication and if her symptoms fail to improve to return for further management of her headaches.   1. Syncope, unspecified syncope type      PLAN: Continue Nortriptyline as directed for headaches prevention  Set up an appointment with a primary care doctor Return if worse    Per [place] [place] statutes, patients with seizures are not allowed to drive until they have been seizure-free for six months.  Other recommendations include using caution when using heavy equipment or power tools. Avoid working on ladders or at heights. Take showers instead of baths.  Do not swim alone.  Ensure the water temperature is not too high on the home water heater. Do not go swimming alone. Do not lock yourself in a room alone (i.e. bathroom). When caring for infants or small children, sit down when holding, feeding, or changing them to minimize risk of injury to the child in the event you have a seizure. Maintain good sleep hygiene. Avoid alcohol.  Also recommend adequate sleep, hydration, good diet and minimize stress.   During the Seizure  - First, ensure adequate ventilation and place patients on the floor  on their left side  Loosen clothing around the neck and ensure the airway is patent. If the patient is clenching the teeth, do not force the mouth open with any object as this can cause severe damage - Remove all items from the surrounding that can be hazardous. The patient may be oblivious to what's happening and may not even know what he or she is doing. If the patient is confused and wandering, either gently guide him/her away and block access to outside areas - Reassure the individual and be comforting - Call 911.  In most cases, the seizure ends before EMS arrives. However, there are cases when seizures may last over 3 to 5 minutes. Or the individual may have developed breathing difficulties or severe injuries. If a pregnant patient or a person with diabetes develops a seizure, it is prudent to call an ambulance. - Finally, if the patient does not regain full consciousness, then call EMS. Most patients will remain confused for about 45 to 90 minutes after a seizure, so you must use judgment in calling for help. - Avoid restraints but make sure the patient is in a bed with padded side rails - Place the individual in a lateral position with the neck slightly flexed; this will help the saliva drain from the mouth and prevent the tongue from falling backward - Remove all nearby furniture and other hazards from the area - Provide verbal assurance as the individual is regaining consciousness - Provide the patient with privacy if possible - Call for help and start treatment as ordered by the caregiver   After the Seizure (Postictal Stage)  After a seizure, most patients experience confusion, fatigue, muscle pain and/or a headache. Thus, one should permit the individual to sleep. For the next few days, reassurance is essential. Being calm and helping reorient the person is also of importance.  Most seizures are painless and end spontaneously. Seizures are not harmful to others but can lead to  complications such as stress on the lungs, brain and the heart. Individuals with prior lung problems may develop labored breathing and respiratory distress.     No orders of the defined types were placed in this encounter.   No orders of the defined types were placed in this encounter.   Return if symptoms worsen or fail to improve.    Windell Norfolk, MD 04/13/2021, 2:20 PM  [facility] [address] [place], [place] [address] [phone]

## 2021-04-13 NOTE — Patient Instructions (Signed)
Continue Nortriptyline as directed for headaches prevention  Set up an appointment with a primary care doctor Return if worse
